# Patient Record
Sex: Male | Born: 1965 | Race: White | Hispanic: No | Marital: Married | Smoking: Never smoker
Health system: Southern US, Community
[De-identification: ages and names within clinical notes are randomized; demographics above are authoritative.]

## PROBLEM LIST (undated history)

## (undated) DIAGNOSIS — K862 Cyst of pancreas: Secondary | ICD-10-CM

## (undated) DIAGNOSIS — F1110 Opioid abuse, uncomplicated: Secondary | ICD-10-CM

## (undated) DIAGNOSIS — K5792 Diverticulitis of intestine, part unspecified, without perforation or abscess without bleeding: Secondary | ICD-10-CM

## (undated) DIAGNOSIS — Z765 Malingerer [conscious simulation]: Secondary | ICD-10-CM

## (undated) DIAGNOSIS — K219 Gastro-esophageal reflux disease without esophagitis: Secondary | ICD-10-CM

## (undated) DIAGNOSIS — I10 Essential (primary) hypertension: Secondary | ICD-10-CM

## (undated) DIAGNOSIS — G43909 Migraine, unspecified, not intractable, without status migrainosus: Secondary | ICD-10-CM

## (undated) DIAGNOSIS — K449 Diaphragmatic hernia without obstruction or gangrene: Secondary | ICD-10-CM

## (undated) DIAGNOSIS — R011 Cardiac murmur, unspecified: Secondary | ICD-10-CM

## (undated) DIAGNOSIS — G8929 Other chronic pain: Secondary | ICD-10-CM

## (undated) HISTORY — DX: Cardiac murmur, unspecified: R01.1

## (undated) HISTORY — DX: Diaphragmatic hernia without obstruction or gangrene: K44.9

## (undated) HISTORY — DX: Migraine, unspecified, not intractable, without status migrainosus: G43.909

## (undated) HISTORY — PX: OTHER SURGICAL HISTORY: SHX169

## (undated) HISTORY — DX: Essential (primary) hypertension: I10

## (undated) HISTORY — DX: Gastro-esophageal reflux disease without esophagitis: K21.9

---

## 2010-07-15 ENCOUNTER — Ambulatory Visit (INDEPENDENT_AMBULATORY_CARE_PROVIDER_SITE_OTHER): Payer: Medicare Other | Admitting: Internal Medicine

## 2010-07-15 DIAGNOSIS — K257 Chronic gastric ulcer without hemorrhage or perforation: Secondary | ICD-10-CM

## 2010-07-29 NOTE — Consult Note (Signed)
NAMEEASTEN, MACEACHERN                 ACCOUNT NO.:  [id number]  MEDICAL RECORD NO.:  [id number]           PATIENT TYPE:  LOCATION:                                 FACILITY:  PHYSICIAN: Dr. Karilyn Cota  DATE OF BIRTH:  December 26, 1965  DATE OF CONSULTATION: DATE OF DISCHARGE:                                CONSULTATION   REASON FOR CONSULTATION:  This is a referral from Dr. Cristy Folks for repeat EGD for gastric ulcer.  HISTORY OF PRESENT ILLNESS:  Shawn Ayala is a 45 year old male referred to our office by Dr. Cristy Folks for followup of his gastric ulcer.  He underwent an EGD on May 29, 2010, for epigastric pain.  He was a patient at [facility].  He underwent an EGD on May 29, 2010.  The EGD revealed a hiatal hernia.  He also had an ulcer in the body of the stomach.  The Z-line was at 30 cm, and he had duodenitis in the bulb of the duodenum.  The biopsy report from the stomach revealed acute gastritis. The findings were consistent with proximity to an ulcer, but did not show the ulcer itself.  He did receive treatment for H. pylori.  He was started on rabeprazole 20 mg p.o. x5 days, amoxicillin 1 g p.o. twice a day x5 days.  Then, he was to start rabeprazole 20 mg x5 days, then clarithromycin 500 mg p.o. b.i.d. x5 days, tinidazole 500 mg p.o. b.i.d. x7 days.  Hemoglobin on May 30, 2010, was 12.5.  Mr. Toral as says he continues to have epigastric pain and it is radiating down into his right abdomen.  He has had symptoms for 3 months.  He also complains of dysphagia for 1 year.  He says foods are lodging in his upper esophagus.  Breads and meets in particular will lodge.  Soft foods such as macaroni and cheese are not lodging.  He has to vomit the bolus back up when this occurs.  He denies any weight loss though his appetite has been decreased.  There has been no constipation or diarrhea.  He usually has two bowel movements a day, brown in color. He denies any melena or rectal bleeding.  He does  complain of epigastric pain.  HOME MEDICATIONS:  At present, he is on omeprazole once a day.  He is allergic to [facility] which causes nausea.  SURGERIES:  He has had a left inguinal hernia repair 2-3 years ago in [place], and he had tubes in his ears as a child.  Previous medical history includes the present gastric ulcer, hiatal hernia, hypertension, and mild clinical retardation.  FAMILY HISTORY:  His mother is deceased from heart failure.  Father is deceased from CVA.  Two sisters in good health.  He is married.  He is disabled.  He does not smoke, drink, or do drugs, and he has one child in good health.  OBJECTIVE:  His weight is 160.8, height 5 feet 3 inches, temperature 97.1, blood pressure 440/86, his pulse is 76.  His sclerae are anicteric.  Conjunctivae is pink.  Teeth, he has multiple missing teeth. His oral mucosa  is moist.  Thyroid is normal.  There is no cervical lymphadenopathy.  His lungs are clear.  Heart is regular rate and rhythm.  I did not hear a murmur.  Abdomen is soft.  Bowel sounds are positive.  He complains of epigastric tenderness on palpation.  There is no edema to his lower extremities.  ASSESSMENT:  Mr. Marple is a 45 year old male with a gastric ulcer.  He is here today for reevaluation.  He is in need of a surveillance EGD to reassess his ulcer to see if it is healing.  He also complains of dysphagia to solids.  RECOMMENDATIONS:  We will schedule an EGD/ED with Dr. Karilyn Cota.  He will continue his omeprazole.  He will take it twice a day instead of once a day.  He is to have his other medications filled by his primary care, Dr. Cristy Folks.  He became very upset when I did not refill his hydrocodone and stormed out of my office.  Thank you for allowing [place] to participate in his care.    ______________________________ Dorene Ar, NP   ______________________________ Dr. Karilyn Cota         TS/MEDQ  D:  07/15/2010  T:  07/16/2010  Job:   [id number]  Electronically Signed by Dorene Ar PA on 07/28/2010 09:07:14 AM Electronically Signed by Lorrin Goodell M.D. on 07/29/2010 10:51:43 AM

## 2010-08-21 ENCOUNTER — Other Ambulatory Visit (INDEPENDENT_AMBULATORY_CARE_PROVIDER_SITE_OTHER): Payer: Self-pay | Admitting: Internal Medicine

## 2010-08-21 ENCOUNTER — Encounter (HOSPITAL_BASED_OUTPATIENT_CLINIC_OR_DEPARTMENT_OTHER): Payer: Medicare Other | Admitting: Internal Medicine

## 2010-08-21 ENCOUNTER — Ambulatory Visit (HOSPITAL_COMMUNITY)
Admission: RE | Admit: 2010-08-21 | Discharge: 2010-08-21 | Disposition: A | Discharge status: Home / Self Care | Payer: Medicare Other | Source: Ambulatory Visit | Attending: Internal Medicine | Admitting: Internal Medicine

## 2010-08-21 DIAGNOSIS — K2960 Other gastritis without bleeding: Secondary | ICD-10-CM | POA: Insufficient documentation

## 2010-08-21 DIAGNOSIS — R1013 Epigastric pain: Secondary | ICD-10-CM | POA: Insufficient documentation

## 2010-08-21 DIAGNOSIS — K222 Esophageal obstruction: Secondary | ICD-10-CM | POA: Insufficient documentation

## 2010-08-21 DIAGNOSIS — K449 Diaphragmatic hernia without obstruction or gangrene: Secondary | ICD-10-CM | POA: Insufficient documentation

## 2010-08-21 DIAGNOSIS — K2270 Barrett's esophagus without dysplasia: Secondary | ICD-10-CM

## 2010-08-21 DIAGNOSIS — R1310 Dysphagia, unspecified: Secondary | ICD-10-CM | POA: Insufficient documentation

## 2010-08-21 DIAGNOSIS — K219 Gastro-esophageal reflux disease without esophagitis: Secondary | ICD-10-CM

## 2010-08-21 DIAGNOSIS — I10 Essential (primary) hypertension: Secondary | ICD-10-CM | POA: Insufficient documentation

## 2010-08-21 DIAGNOSIS — Z79899 Other long term (current) drug therapy: Secondary | ICD-10-CM | POA: Insufficient documentation

## 2010-08-21 DIAGNOSIS — K259 Gastric ulcer, unspecified as acute or chronic, without hemorrhage or perforation: Secondary | ICD-10-CM

## 2010-09-02 ENCOUNTER — Other Ambulatory Visit (INDEPENDENT_AMBULATORY_CARE_PROVIDER_SITE_OTHER): Payer: Self-pay | Admitting: Internal Medicine

## 2010-09-02 DIAGNOSIS — R1013 Epigastric pain: Secondary | ICD-10-CM

## 2010-09-04 ENCOUNTER — Ambulatory Visit (HOSPITAL_COMMUNITY): Payer: Medicare Other

## 2010-09-08 ENCOUNTER — Ambulatory Visit (INDEPENDENT_AMBULATORY_CARE_PROVIDER_SITE_OTHER): Payer: Medicare Other | Admitting: Internal Medicine

## 2010-09-15 ENCOUNTER — Ambulatory Visit (HOSPITAL_COMMUNITY)
Admission: RE | Admit: 2010-09-15 | Discharge: 2010-09-15 | Disposition: A | Discharge status: Home / Self Care | Payer: Medicare Other | Source: Ambulatory Visit | Attending: Internal Medicine | Admitting: Internal Medicine

## 2010-09-15 DIAGNOSIS — R1013 Epigastric pain: Secondary | ICD-10-CM | POA: Insufficient documentation

## 2010-09-15 DIAGNOSIS — Q619 Cystic kidney disease, unspecified: Secondary | ICD-10-CM | POA: Insufficient documentation

## 2010-09-15 NOTE — Op Note (Signed)
Shawn Ayala, Shawn Ayala                 ACCOUNT NO.:  [id number]  MEDICAL RECORD NO.:  [id number]           PATIENT TYPE:  O  LOCATION:  [facility]                          FACILITY:  [facility]  PHYSICIAN:  Lionel December, M.D.    DATE OF BIRTH:  07/25/1965  DATE OF PROCEDURE:  08/21/2010 DATE OF DISCHARGE:                              OPERATIVE REPORT   PROCEDURE:  Esophagogastroduodenoscopy with esophageal dilation.  INDICATION:  Shawn Ayala is a 45 year old Caucasian male who continues to experience sharp epigastric pain.  He also complains of dysphagia to solids.  He was diagnosed with proximal gastric ulcer by Dr. Mikal Plane of [place], [place] on May 29, 2010.  Biopsy was taken from this ulcer margin, was benign and his CLO test was positive and he has had 1 week of therapy with tinidazole, AcipHex, and clarithromycin.  He denies weight loss or melena.  He also complains of pain in his lower abdomen which is felt to be a separate tissue.  Procedure risks were reviewed with the patient.  Informed consent was obtained.  MEDICATIONS FOR CONSCIOUS SEDATION:  Cetacaine spray for pharyngeal topical anesthesia, Demerol 50 mg IV, and Versed 5 mg IV.  FINDINGS:  The procedure performed in endoscopy suite.  The patient's vital signs and O2 saturations were monitored during the procedure and remained stable.  The patient was placed in left lateral recumbent position and Pentax videoscope was passed via oropharynx without any difficulty into esophagus.  Esophagus:  Mucosa of the proximal segment was normal.  Distally, there was extensive scarring with pseudodiverticular formation and noncritical narrowing.  His esophagus was felt to be rather short.  GE junction was estimated to be at 29 cm and he has salmon-colored mucosa above that. Length was estimated to be less than 2 cm, possibly around 15-16 mm.  A large hiatal hernia with dependent segment on the left side with fluid in it.  Hiatus was  wide open.  There were multiple linear erosions at the level of hiatus.  There was a scar on the right side and there were 2 small ulcers, 3-4 mm, on the left side surrounded by edematous and erythematous mucosa.  Stomach:  Part of the stomach that was below the diaphragm reveals no abnormality except prepyloric erythema and a scar.  Pyloric channel was patent.  Hernia was easily seen on retroflex view.  Duodenum:  Bulbar mucosa was normal.  Scope was passed to second part of duodenum where mucosa and folds were normal.  Esophageal stricture was dilated with a balloon initially to 15 mm and carried to 16.5 and 18 mm, but no mucosal disruption induced.  As the balloon was deflated and withdrawn, antral biopsy was taken for CLO test, followed by biopsy from the salmon-colored mucosa to confirm that he has Barrett esophagus.  Endoscope was withdrawn.  The patient tolerated the procedure well.  FINAL DIAGNOSES: 1. 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 2. 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 3. Large sliding hiatal hernia with dependent segment on the left side     with 2 small ulcers.  A third ulcer has healed.  Multiple erosions     also noted at this level. 4. Prepyloric gastritis. 5. Biopsy taken for CLO test.  RECOMMENDATIONS:  Antireflux measures reinforced.  We will increase his omeprazole to 20 mg before breakfast and evening meal.  Prescription given for 60 with 5 refills.  I will be contacting the patient with results of biopsy and further recommendations.          ______________________________ Lionel December, M.D.     NR/MEDQ  D:  08/21/2010  T:  08/22/2010  Job:  [id number]  cc:   Mikal Plane, MD  Electronically Signed by Lionel December M.D. on 09/15/2010 10:02:58 AM

## 2010-09-23 ENCOUNTER — Ambulatory Visit (INDEPENDENT_AMBULATORY_CARE_PROVIDER_SITE_OTHER): Payer: Medicare Other | Admitting: Internal Medicine

## 2010-09-23 DIAGNOSIS — R109 Unspecified abdominal pain: Secondary | ICD-10-CM

## 2010-09-23 DIAGNOSIS — K219 Gastro-esophageal reflux disease without esophagitis: Secondary | ICD-10-CM

## 2011-03-04 ENCOUNTER — Encounter (INDEPENDENT_AMBULATORY_CARE_PROVIDER_SITE_OTHER): Payer: Self-pay | Admitting: *Deleted

## 2011-03-12 ENCOUNTER — Ambulatory Visit (INDEPENDENT_AMBULATORY_CARE_PROVIDER_SITE_OTHER): Payer: Medicare Other | Admitting: Internal Medicine

## 2011-03-25 ENCOUNTER — Ambulatory Visit (INDEPENDENT_AMBULATORY_CARE_PROVIDER_SITE_OTHER): Payer: Medicare Other | Admitting: Internal Medicine

## 2011-03-25 ENCOUNTER — Encounter (INDEPENDENT_AMBULATORY_CARE_PROVIDER_SITE_OTHER): Payer: Self-pay | Admitting: Internal Medicine

## 2011-03-25 VITALS — BP 132/80 | HR 80 | Temp 97.3°F | Ht 63.0 in | Wt 157.5 lb

## 2011-03-25 DIAGNOSIS — K219 Gastro-esophageal reflux disease without esophagitis: Secondary | ICD-10-CM | POA: Insufficient documentation

## 2011-03-25 DIAGNOSIS — I10 Essential (primary) hypertension: Secondary | ICD-10-CM | POA: Insufficient documentation

## 2011-03-25 NOTE — Progress Notes (Signed)
Subjective:     Patient ID: Shawn Ayala, male   DOB: [birth year], 46 y.o.   MRN: [id number]  HPI  Shawn Ayala is a 46 yr old male here today for follow up. He was last seen in our office for GERD. He underwent underwent and EGD in May of this year.  He had a short esophagus with extensive scarring and pseudo diverticular formation and a soft stricture at the GE junction which was dilated.  Biopsy revealed no evidence of intestinal metaplasia, dysplasia or malignancy. H. Pylori was negative. He tells me his heart burn is okay as long as he takes the omeprazole.  He does not have nausea or vomiting.  He   has dysphagia a couple of times a week.  He says he has an appt in [place] with a specialist concerning his hiatal hernia on the 8th of t his month. He does not want to be scheduled for a Barium Swallow test at this time.  He cannot eat roast or chicken or anything stringy.  He has changed his eating habits.  He has lost 5 pounds since his last visit.  Appetite is not good. He says he afraid to eat because he is afraid foods will lodge.  He has a BM about 1-2 times a day. No rectal bleeding or melena.   He also c/o frequent migraine headaches and is taking Fioricet which he says is not helping. 09/15/2010 US abdomen for epigastric pain: Normal sonographic appearance of the gallbladder and normal caliber CBD. 2 cm mid pole right renal cyst. Review of Systems see hpi Current Outpatient Prescriptions  Medication Sig Dispense Refill  . cloNIDine (CATAPRES) 0.1 MG tablet Take 0.1 mg by mouth 2 (two) times daily.        Marland Kitchen omeprazole (PRILOSEC) 20 MG capsule Take 20 mg by mouth daily.         Past Medical History  Diagnosis Date  . GERD (gastroesophageal reflux disease)   . Hypertension   . Heart murmur   . Hiatal hernia   . Migraines    Past Surgical History  Procedure Date  . Left inguinal hernia repair    History   Social History  . Marital Status: Married    Spouse Name: N/A    Number of Children:  N/A  . Years of Education: N/A   Occupational History  . Not on file.   Social History Main Topics  . Smoking status: Never Smoker   . Smokeless tobacco: Not on file   Comment: Uses snuff since age 77yr  . Alcohol Use: No  . Drug Use: No  . Sexually Active: Not on file   Other Topics Concern  . Not on file   Social History Narrative  . No narrative on file   Family Status  Relation Status Death Age  . Mother Deceased     CHF  . Father Deceased     CVA  . Sister Alive     fair health   Allergies  Allergen Reactions  . Darvocet (Propoxyphene N-Acetaminophen)   . Tramadol        Objective:   Physical Exam Filed Vitals:   03/25/11 1502  Height: 5\' 3"  (1.6 m)  Weight: 157 lb 8 oz (71.442 kg)    Alert and oriented. Skin warm and dry. Oral mucosa is moist. Natural teeth in good condition. Sclera anicteric, conjunctivae is pink. Thyroid not enlarged. No cervical lymphadenopathy. Lungs clear. Heart regular rate and rhythm.  Abdomen is  soft. Bowel sounds are positive. No hepatomegaly. No abdominal masses felt. Epigastric tenderness.  No edema to lower extremities. Patient is alert and oriented.     Assessment:    GERD for the most part controlled at this time. With the Omeprazole. Dysphagia.  He would like to put the Barium Swallow on hold till he sees specialist for his hiatal hernia.    Plan:    Continue the Omeprazole.  OV in 6 months.  Avoid foods that are known to lodge in your esophagus.  Chew food well.

## 2011-03-25 NOTE — Patient Instructions (Addendum)
Continue present medications. Keep appt with specialist this week for your hiatal hernia. OV in 6 months. Find a PCP to manage your headaches for pain management

## 2011-09-01 ENCOUNTER — Other Ambulatory Visit (INDEPENDENT_AMBULATORY_CARE_PROVIDER_SITE_OTHER): Payer: Self-pay | Admitting: Internal Medicine

## 2011-09-22 ENCOUNTER — Ambulatory Visit (INDEPENDENT_AMBULATORY_CARE_PROVIDER_SITE_OTHER): Payer: Medicare Other | Admitting: Internal Medicine

## 2013-06-11 ENCOUNTER — Encounter (HOSPITAL_COMMUNITY): Payer: Self-pay | Admitting: *Deleted

## 2013-06-11 ENCOUNTER — Encounter (HOSPITAL_COMMUNITY)
Admission: EM | Disposition: A | Discharge status: Home / Self Care | Payer: Self-pay | Source: Other Acute Inpatient Hospital | Attending: Internal Medicine

## 2013-06-11 ENCOUNTER — Inpatient Hospital Stay (HOSPITAL_COMMUNITY)
Admission: EM | Admit: 2013-06-11 | Discharge: 2013-06-16 | DRG: 392 | Disposition: A | Discharge status: Home / Self Care | Payer: Medicare Other | Source: Other Acute Inpatient Hospital | Attending: Internal Medicine | Admitting: Internal Medicine

## 2013-06-11 DIAGNOSIS — D509 Iron deficiency anemia, unspecified: Secondary | ICD-10-CM

## 2013-06-11 DIAGNOSIS — R112 Nausea with vomiting, unspecified: Secondary | ICD-10-CM

## 2013-06-11 DIAGNOSIS — K222 Esophageal obstruction: Secondary | ICD-10-CM | POA: Diagnosis present

## 2013-06-11 DIAGNOSIS — K862 Cyst of pancreas: Secondary | ICD-10-CM

## 2013-06-11 DIAGNOSIS — K311 Adult hypertrophic pyloric stenosis: Secondary | ICD-10-CM

## 2013-06-11 DIAGNOSIS — R1013 Epigastric pain: Secondary | ICD-10-CM

## 2013-06-11 DIAGNOSIS — Y69 Unspecified misadventure during surgical and medical care: Secondary | ICD-10-CM | POA: Diagnosis present

## 2013-06-11 DIAGNOSIS — F329 Major depressive disorder, single episode, unspecified: Secondary | ICD-10-CM | POA: Diagnosis present

## 2013-06-11 DIAGNOSIS — T148XXA Other injury of unspecified body region, initial encounter: Secondary | ICD-10-CM | POA: Diagnosis present

## 2013-06-11 DIAGNOSIS — F1910 Other psychoactive substance abuse, uncomplicated: Secondary | ICD-10-CM | POA: Diagnosis present

## 2013-06-11 DIAGNOSIS — K3184 Gastroparesis: Principal | ICD-10-CM

## 2013-06-11 DIAGNOSIS — D500 Iron deficiency anemia secondary to blood loss (chronic): Secondary | ICD-10-CM

## 2013-06-11 DIAGNOSIS — G988 Other disorders of nervous system: Secondary | ICD-10-CM | POA: Diagnosis present

## 2013-06-11 DIAGNOSIS — K869 Disease of pancreas, unspecified: Secondary | ICD-10-CM

## 2013-06-11 DIAGNOSIS — R634 Abnormal weight loss: Secondary | ICD-10-CM

## 2013-06-11 DIAGNOSIS — D126 Benign neoplasm of colon, unspecified: Secondary | ICD-10-CM

## 2013-06-11 DIAGNOSIS — K219 Gastro-esophageal reflux disease without esophagitis: Secondary | ICD-10-CM

## 2013-06-11 DIAGNOSIS — T4275XA Adverse effect of unspecified antiepileptic and sedative-hypnotic drugs, initial encounter: Secondary | ICD-10-CM | POA: Diagnosis present

## 2013-06-11 DIAGNOSIS — Z79899 Other long term (current) drug therapy: Secondary | ICD-10-CM

## 2013-06-11 DIAGNOSIS — F1210 Cannabis abuse, uncomplicated: Secondary | ICD-10-CM | POA: Diagnosis present

## 2013-06-11 DIAGNOSIS — Z8711 Personal history of peptic ulcer disease: Secondary | ICD-10-CM

## 2013-06-11 DIAGNOSIS — G8929 Other chronic pain: Secondary | ICD-10-CM

## 2013-06-11 DIAGNOSIS — E46 Unspecified protein-calorie malnutrition: Secondary | ICD-10-CM | POA: Diagnosis present

## 2013-06-11 DIAGNOSIS — F3289 Other specified depressive episodes: Secondary | ICD-10-CM | POA: Diagnosis present

## 2013-06-11 DIAGNOSIS — K209 Esophagitis, unspecified without bleeding: Secondary | ICD-10-CM

## 2013-06-11 DIAGNOSIS — K5730 Diverticulosis of large intestine without perforation or abscess without bleeding: Secondary | ICD-10-CM

## 2013-06-11 DIAGNOSIS — K863 Pseudocyst of pancreas: Secondary | ICD-10-CM

## 2013-06-11 DIAGNOSIS — I10 Essential (primary) hypertension: Secondary | ICD-10-CM

## 2013-06-11 DIAGNOSIS — K449 Diaphragmatic hernia without obstruction or gangrene: Secondary | ICD-10-CM | POA: Diagnosis present

## 2013-06-11 DIAGNOSIS — D490 Neoplasm of unspecified behavior of digestive system: Secondary | ICD-10-CM

## 2013-06-11 DIAGNOSIS — K8689 Other specified diseases of pancreas: Secondary | ICD-10-CM

## 2013-06-11 DIAGNOSIS — E876 Hypokalemia: Secondary | ICD-10-CM

## 2013-06-11 HISTORY — DX: Cyst of pancreas: K86.2

## 2013-06-11 HISTORY — PX: ESOPHAGOGASTRODUODENOSCOPY: SHX5428

## 2013-06-11 LAB — COMPREHENSIVE METABOLIC PANEL
ALBUMIN: 3.1 g/dL — AB (ref 3.5–5.2)
ALT: 7 U/L (ref 0–53)
AST: 14 U/L (ref 0–37)
Alkaline Phosphatase: 61 U/L (ref 39–117)
BUN: 9 mg/dL (ref 6–23)
CHLORIDE: 102 meq/L (ref 96–112)
CO2: 28 mEq/L (ref 19–32)
CREATININE: 0.81 mg/dL (ref 0.50–1.35)
Calcium: 8.6 mg/dL (ref 8.4–10.5)
GFR calc Af Amer: >90 mL/min (ref >90)
GFR calc non Af Amer: >90 mL/min (ref >90)
Glucose, Bld: 91 mg/dL (ref 70–99)
POTASSIUM: 3.3 meq/L — AB (ref 3.7–5.3)
Sodium: 143 mEq/L (ref 137–147)
TOTAL PROTEIN: 6.2 g/dL (ref 6.0–8.3)
Total Bilirubin: 0.3 mg/dL (ref 0.3–1.2)

## 2013-06-11 LAB — ABO/RH: ABO/RH(D): O POS

## 2013-06-11 LAB — RAPID URINE DRUG SCREEN, HOSP PERFORMED
Amphetamines: NOT DETECTED
BARBITURATES: NOT DETECTED
BENZODIAZEPINES: NOT DETECTED
Cocaine: POSITIVE — AB
Opiates: POSITIVE — AB
Tetrahydrocannabinol: POSITIVE — AB

## 2013-06-11 LAB — TSH: TSH: 1.189 u[IU]/mL (ref 0.350–4.500)

## 2013-06-11 LAB — CBC
HEMATOCRIT: 24.9 % — AB (ref 39.0–52.0)
Hemoglobin: 6.8 g/dL — CL (ref 13.0–17.0)
MCH: 17 pg — ABNORMAL LOW (ref 26.0–34.0)
MCHC: 27.3 g/dL — ABNORMAL LOW (ref 30.0–36.0)
MCV: 62.4 fL — ABNORMAL LOW (ref 78.0–100.0)
PLATELETS: 577 10*3/uL — AB (ref 150–400)
RBC: 3.99 MIL/uL — AB (ref 4.22–5.81)
RDW: 19.5 % — ABNORMAL HIGH (ref 11.5–15.5)
WBC: 6.9 10*3/uL (ref 4.0–10.5)

## 2013-06-11 LAB — FERRITIN: Ferritin: 1 ng/mL — ABNORMAL LOW (ref 22–322)

## 2013-06-11 LAB — FOLATE

## 2013-06-11 LAB — IRON AND TIBC
IRON: 21 ug/dL — AB (ref 42–135)
Saturation Ratios: 5 % — ABNORMAL LOW (ref 20–55)
TIBC: 409 ug/dL (ref 215–435)
UIBC: 388 ug/dL (ref 125–400)

## 2013-06-11 LAB — PREPARE RBC (CROSSMATCH)

## 2013-06-11 LAB — RETICULOCYTES
RBC.: 3.99 MIL/uL — ABNORMAL LOW (ref 4.22–5.81)
RETIC COUNT ABSOLUTE: 55.9 10*3/uL (ref 19.0–186.0)
Retic Ct Pct: 1.4 % (ref 0.4–3.1)

## 2013-06-11 LAB — VITAMIN B12: Vitamin B-12: 302 pg/mL (ref 211–911)

## 2013-06-11 LAB — MAGNESIUM: Magnesium: 2.1 mg/dL (ref 1.5–2.5)

## 2013-06-11 SURGERY — EGD (ESOPHAGOGASTRODUODENOSCOPY)
Anesthesia: Moderate Sedation

## 2013-06-11 MED ORDER — METOCLOPRAMIDE HCL 5 MG/ML IJ SOLN
10.0000 mg | Freq: Four times a day (QID) | INTRAMUSCULAR | Status: DC
  Administered 2013-06-11 – 2013-06-12 (×5): 10 mg via INTRAVENOUS
  Filled 2013-06-11 (×8): qty 2

## 2013-06-11 MED ORDER — SODIUM CHLORIDE 0.9 % IV SOLN
INTRAVENOUS | Status: DC
  Administered 2013-06-11 – 2013-06-15 (×5): via INTRAVENOUS

## 2013-06-11 MED ORDER — ACETAMINOPHEN 650 MG RE SUPP: 650.0000 mg | Freq: Four times a day (QID) | RECTAL | Status: DC | PRN

## 2013-06-11 MED ORDER — HYDROMORPHONE HCL PF 1 MG/ML IJ SOLN
INTRAMUSCULAR | Status: AC
  Filled 2013-06-11: qty 1

## 2013-06-11 MED ORDER — MIDAZOLAM HCL 10 MG/2ML IJ SOLN
INTRAMUSCULAR | Status: DC | PRN
  Administered 2013-06-11 (×2): 2 mg via INTRAVENOUS

## 2013-06-11 MED ORDER — FENTANYL CITRATE 0.05 MG/ML IJ SOLN
INTRAMUSCULAR | Status: AC
  Filled 2013-06-11: qty 2

## 2013-06-11 MED ORDER — HYDROMORPHONE HCL PF 1 MG/ML IJ SOLN
1.0000 mg | INTRAMUSCULAR | Status: DC | PRN
  Administered 2013-06-11 (×2): 2 mg via INTRAVENOUS
  Administered 2013-06-11 – 2013-06-12 (×4): 1 mg via INTRAVENOUS
  Filled 2013-06-11: qty 2
  Filled 2013-06-11 (×4): qty 1

## 2013-06-11 MED ORDER — FENTANYL CITRATE 0.05 MG/ML IJ SOLN
INTRAMUSCULAR | Status: DC | PRN
  Administered 2013-06-11 (×2): 25 ug via INTRAVENOUS

## 2013-06-11 MED ORDER — CLONIDINE HCL 0.1 MG PO TABS
0.1000 mg | ORAL_TABLET | Freq: Two times a day (BID) | ORAL | Status: DC
  Administered 2013-06-11 – 2013-06-16 (×10): 0.1 mg via ORAL
  Filled 2013-06-11 (×14): qty 1

## 2013-06-11 MED ORDER — ACETAMINOPHEN 325 MG PO TABS
650.0000 mg | ORAL_TABLET | Freq: Four times a day (QID) | ORAL | Status: DC | PRN
  Administered 2013-06-12: 650 mg via ORAL
  Filled 2013-06-11 (×2): qty 2

## 2013-06-11 MED ORDER — ONDANSETRON HCL 4 MG PO TABS: 4.0000 mg | ORAL_TABLET | Freq: Four times a day (QID) | ORAL | Status: DC | PRN

## 2013-06-11 MED ORDER — ONDANSETRON HCL 4 MG/2ML IJ SOLN
4.0000 mg | Freq: Four times a day (QID) | INTRAMUSCULAR | Status: DC | PRN
  Administered 2013-06-11 – 2013-06-14 (×3): 4 mg via INTRAVENOUS
  Filled 2013-06-11 (×3): qty 2

## 2013-06-11 MED ORDER — BUTAMBEN-TETRACAINE-BENZOCAINE 2-2-14 % EX AERO
INHALATION_SPRAY | CUTANEOUS | Status: DC | PRN
  Administered 2013-06-11: 2 via TOPICAL

## 2013-06-11 MED ORDER — HYDROMORPHONE HCL PF 1 MG/ML IJ SOLN
1.0000 mg | INTRAMUSCULAR | Status: DC | PRN
  Administered 2013-06-11: 1 mg via INTRAVENOUS
  Filled 2013-06-11: qty 1

## 2013-06-11 MED ORDER — MIDAZOLAM HCL 5 MG/ML IJ SOLN
INTRAMUSCULAR | Status: AC
  Filled 2013-06-11: qty 2

## 2013-06-11 MED ORDER — POTASSIUM CHLORIDE 10 MEQ/100ML IV SOLN
10.0000 meq | INTRAVENOUS | Status: AC
  Administered 2013-06-11 – 2013-06-12 (×3): 10 meq via INTRAVENOUS
  Filled 2013-06-11 (×2): qty 100

## 2013-06-11 MED ORDER — POTASSIUM CHLORIDE 10 MEQ/100ML IV SOLN
10.0000 meq | INTRAVENOUS | Status: AC
  Administered 2013-06-11: 10 meq via INTRAVENOUS
  Filled 2013-06-11: qty 100

## 2013-06-11 MED ORDER — PANTOPRAZOLE SODIUM 40 MG IV SOLR
40.0000 mg | Freq: Two times a day (BID) | INTRAVENOUS | Status: DC
  Administered 2013-06-11 – 2013-06-12 (×3): 40 mg via INTRAVENOUS
  Filled 2013-06-11 (×4): qty 40

## 2013-06-11 NOTE — H&P (View-Only) (Signed)
Referring Provider: No ref. provider found Primary Care Physician:  No primary provider on file. Primary Gastroenterologist:  Dr. Laural Golden in [place]  Reason for Consultation:  GOO and pancreatic lesion  HPI: Shawn Ayala is a 48 y.o. 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  He has a history of peptic ulcer disease documented by EGD in 08/2010 by Dr. Laural Golden with EGJ stricture, large HH and ulcers at [facility] and underwent balloon esophageal dilatation. He had a laparoscopic reduction and repair of his HH in [place] 3 years ago.  This recurred 3 months later and another surgeon performed an open hiatal hernia repair. Since that second operation he has had a 70 pound weight loss, vomits food and drink. Occasionally sees some green emesis but mostly nonbilious. Over the past 2 years, despite the weight loss and vomiting, he has not had another upper endoscopy or colonoscopy. He has apparently never had a colonoscopy. He has been found to be anemic with Hgb 6.8 grams and is currently being transfuse with his first unit of PRBC's.  MCV is very low and iron studies, Vitamin B12 levels and folate are pending.   His old CT scans from 3 years ago did show a large type III hiatal hernia but there was no pancreatic mass according to review between the medicine team and radiology.  Current CT scan shows significant distention of the stomach all the way down to the duodenal bulb. No contrast passes the duodenal bulb. There is no mass or adenopathy there and the cause of the obstruction is not clear. On the anterior surface of the pancreas there is a 2 cm hypodense lesion. This is very smooth. Does not look invasive. This is very anterior and does not appear to be the cause of the obstruction.   Patient states that since the surgery he has not been able to eat or drink much at all, but it has become  progressively worse over the past 6-8 months to the point where even liquids come back up.  Says that sometimes they come up within 15 minutes, but other times it is an hour.  Also has foul smell belching.  Has pain constantly.  Is upset and frustrated.  Says that the surgeon who performed the last surgery in [place] only saw him one time post-op and has refused to see him since then.  Patient says that this has taken a huge toll on his life to the point where is wife wants to leave him.   Past Medical History  Diagnosis Date  . GERD (gastroesophageal reflux disease)   . Hypertension   . Heart murmur   . Hiatal hernia   . Migraines     Past Surgical History  Procedure Laterality Date  . Left inguinal hernia repair      Prior to Admission medications   Medication Sig Start Date End Date Taking? Authorizing Provider  acetaminophen (TYLENOL) 325 MG tablet Take 650 mg by mouth every 6 (six) hours as needed for mild pain, fever or headache.   Yes Historical Provider, MD  cloNIDine (CATAPRES) 0.1 MG tablet Take 0.1 mg by mouth 2 (two) times daily.     Yes Historical Provider, MD  esomeprazole (NEXIUM) 40 MG capsule Take 40 mg by mouth 2 (two) times daily before a meal.   Yes Historical Provider, MD    Current Facility-Administered Medications  Medication Dose Route Frequency  Provider Last Rate Last Dose  . 0.9 %  sodium chloride infusion   Intravenous Continuous Kathie Dike, MD 75 mL/hr at 06/11/13 0429    . acetaminophen (TYLENOL) tablet 650 mg  650 mg Oral Q6H PRN Kathie Dike, MD       Or  . acetaminophen (TYLENOL) suppository 650 mg  650 mg Rectal Q6H PRN Kathie Dike, MD      . cloNIDine (CATAPRES) tablet 0.1 mg  0.1 mg Oral BID Kathie Dike, MD      . HYDROmorphone (DILAUDID) injection 1-2 mg  1-2 mg Intravenous Q4H PRN Delfina Redwood, MD   2 mg at 06/11/13 0900  . ondansetron (ZOFRAN) tablet 4 mg  4 mg Oral Q6H PRN Kathie Dike, MD       Or  . ondansetron  (ZOFRAN) injection 4 mg  4 mg Intravenous Q6H PRN Kathie Dike, MD   4 mg at 06/11/13 0801  . pantoprazole (PROTONIX) injection 40 mg  40 mg Intravenous Q12H Kathie Dike, MD      . potassium chloride 10 mEq in 100 mL IVPB  10 mEq Intravenous Q1 Hr x 4 Delfina Redwood, MD        Allergies as of 06/11/2013 - Review Complete 06/11/2013  Allergen Reaction Noted  . Darvocet [propoxyphene n-acetaminophen]  03/25/2011  . Nsaids  06/11/2013  . Tramadol Nausea And Vomiting 03/25/2011    History reviewed. No pertinent family history.  History   Social History  . Marital Status: Married    Spouse Name: N/A    Number of Children: N/A  . Years of Education: N/A   Occupational History  . Not on file.   Social History Main Topics  . Smoking status: Never Smoker   . Smokeless tobacco: Not on file     Comment: Uses snuff since age 71yr  . Alcohol Use: No  . Drug Use: No  . Sexual Activity: Not on file   Other Topics Concern  . Not on file   Social History Narrative  . No narrative on file    Review of Systems: Ten point ROS is O/W negative except as mentioned in HPI.  Physical Exam: Vital signs in last 24 hours: Temp:  [97.9 F (36.6 C)-98.4 F (36.9 C)] 98.2 F (36.8 C) (03/22 0945) Pulse Rate:  [67-76] 70 (03/22 0945) Resp:  [16-18] 18 (03/22 0945) BP: (127-142)/(79-96) 127/88 mmHg (03/22 0945) SpO2:  [95 %-100 %] 95 % (03/22 0945) Weight:  [112 lb (50.803 kg)] 112 lb (50.803 kg) (03/22 0549) Last BM Date: 06/10/13 General:  Alert, thin, pleasant and cooperative in NAD Head:  Normocephalic and atraumatic. Eyes:  Sclera clear, no icterus.  Conjunctiva pink. Ears:  Normal auditory acuity. Mouth:  No deformity or lesions.   Lungs:  Clear throughout to auscultation.  No wheezes, crackles, or rhonchi.  Heart:  Regular rate and rhythm; no murmurs, clicks, rubs, or gallops. Abdomen:  Soft, non-distended.  BS present.  TTP in epigastrium without R/R/G.   Rectal:   Deferred  Msk:  Symmetrical without gross deformities. Pulses:  Normal pulses noted. Extremities:  Without clubbing or edema. Neurologic:  Alert and  oriented x4;  grossly normal neurologically. Skin:  Intact without significant lesions or rashes. Psych:  Alert and cooperative. Normal mood and affect.  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  No mass is apparent on CT scan.  ? chronic PUD vs stricture.  ? Damage to vagus nerve during surgery. -2 cm pancreatic mass:  Does not appear to be the cause of obstruction. -Malnutrition with 70 pound weight loss in 2 years -Microcytic anemia:  Likely due to nutritional deficiencies and no absorption through the small bowel.   PLAN: -Continue IV PPI BID. -Surgery is following as well and trying to obtain previous operative reports from [place], [place]. -EGD today, per Dr. Fuller Plan. -Will likely need EUS at some point as well for tissue diagnosis of pancreatic mass. -Follow-up CA19-9.  ZEHR, JESSICA D.  06/11/2013, 10:03 AM  Pager number [phone]      Attending physician's note   I have taken a history, reviewed the chart and examined the patient. I agree with the Advanced Practitioner's note, impression and recommendations. Gastric distention suggesting GOO at level of duodenal bulb by CT, possible gastroparesis, 2 cm pancreatic lesion, weight loss, malnutrition, microcytic anemia. R/O duodenal ulcer, duodenal stricture, vagus nerve injury with gastroparesis or other obstructing  process. EGD today and IV PPI bid for now. Further evaluation of the pancreatic lesion with MRCP and/or EUS when current problems improved. Likely will need colonoscopy at some point to further evaluate anemia and weight loss. Surgery following.  Pricilla Riffle. Fuller Plan, MD [facility]

## 2013-06-11 NOTE — Progress Notes (Signed)
Called by RN with hgb 6.8. Discussed with Dr. Roderic Palau. Transfuse 1 unit PRBC. Full note to follow.  Doree Barthel, M.D.

## 2013-06-11 NOTE — Consult Note (Signed)
Referring Provider: No ref. provider found Primary Care Physician:  No primary provider on file. Primary Gastroenterologist:  Dr. Laural Golden in [place]  Reason for Consultation:  GOO and pancreatic lesion  HPI: Shawn Ayala is a 48 y.o. 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  He has a history of peptic ulcer disease documented by EGD in 08/2010 by Dr. Laural Golden with EGJ stricture, large HH and ulcers at [facility] and underwent balloon esophageal dilatation. He had a laparoscopic reduction and repair of his HH in [place] 3 years ago.  This recurred 3 months later and another surgeon performed an open hiatal hernia repair. Since that second operation he has had a 70 pound weight loss, vomits food and drink. Occasionally sees some green emesis but mostly nonbilious. Over the past 2 years, despite the weight loss and vomiting, he has not had another upper endoscopy or colonoscopy. He has apparently never had a colonoscopy. He has been found to be anemic with Hgb 6.8 grams and is currently being transfuse with his first unit of PRBC's.  MCV is very low and iron studies, Vitamin B12 levels and folate are pending.   His old CT scans from 3 years ago did show a large type III hiatal hernia but there was no pancreatic mass according to review between the medicine team and radiology.  Current CT scan shows significant distention of the stomach all the way down to the duodenal bulb. No contrast passes the duodenal bulb. There is no mass or adenopathy there and the cause of the obstruction is not clear. On the anterior surface of the pancreas there is a 2 cm hypodense lesion. This is very smooth. Does not look invasive. This is very anterior and does not appear to be the cause of the obstruction.   Patient states that since the surgery he has not been able to eat or drink much at all, but it has become  progressively worse over the past 6-8 months to the point where even liquids come back up.  Says that sometimes they come up within 15 minutes, but other times it is an hour.  Also has foul smell belching.  Has pain constantly.  Is upset and frustrated.  Says that the surgeon who performed the last surgery in [place] only saw him one time post-op and has refused to see him since then.  Patient says that this has taken a huge toll on his life to the point where is wife wants to leave him.   Past Medical History  Diagnosis Date  . GERD (gastroesophageal reflux disease)   . Hypertension   . Heart murmur   . Hiatal hernia   . Migraines     Past Surgical History  Procedure Laterality Date  . Left inguinal hernia repair      Prior to Admission medications   Medication Sig Start Date End Date Taking? Authorizing Provider  acetaminophen (TYLENOL) 325 MG tablet Take 650 mg by mouth every 6 (six) hours as needed for mild pain, fever or headache.   Yes Historical Provider, MD  cloNIDine (CATAPRES) 0.1 MG tablet Take 0.1 mg by mouth 2 (two) times daily.     Yes Historical Provider, MD  esomeprazole (NEXIUM) 40 MG capsule Take 40 mg by mouth 2 (two) times daily before a meal.   Yes Historical Provider, MD    Current Facility-Administered Medications  Medication Dose Route Frequency  Provider Last Rate Last Dose  . 0.9 %  sodium chloride infusion   Intravenous Continuous Jehanzeb Memon, MD 75 mL/hr at 06/11/13 0429    . acetaminophen (TYLENOL) tablet 650 mg  650 mg Oral Q6H PRN Jehanzeb Memon, MD       Or  . acetaminophen (TYLENOL) suppository 650 mg  650 mg Rectal Q6H PRN Jehanzeb Memon, MD      . cloNIDine (CATAPRES) tablet 0.1 mg  0.1 mg Oral BID Jehanzeb Memon, MD      . HYDROmorphone (DILAUDID) injection 1-2 mg  1-2 mg Intravenous Q4H PRN Corinna L Sullivan, MD   2 mg at 06/11/13 0900  . ondansetron (ZOFRAN) tablet 4 mg  4 mg Oral Q6H PRN Jehanzeb Memon, MD       Or  . ondansetron  (ZOFRAN) injection 4 mg  4 mg Intravenous Q6H PRN Jehanzeb Memon, MD   4 mg at 06/11/13 0801  . pantoprazole (PROTONIX) injection 40 mg  40 mg Intravenous Q12H Jehanzeb Memon, MD      . potassium chloride 10 mEq in 100 mL IVPB  10 mEq Intravenous Q1 Hr x 4 Corinna L Sullivan, MD        Allergies as of 06/11/2013 - Review Complete 06/11/2013  Allergen Reaction Noted  . Darvocet [propoxyphene n-acetaminophen]  03/25/2011  . Nsaids  06/11/2013  . Tramadol Nausea And Vomiting 03/25/2011    History reviewed. No pertinent family history.  History   Social History  . Marital Status: Married    Spouse Name: N/A    Number of Children: N/A  . Years of Education: N/A   Occupational History  . Not on file.   Social History Main Topics  . Smoking status: Never Smoker   . Smokeless tobacco: Not on file     Comment: Uses snuff since age 7yr  . Alcohol Use: No  . Drug Use: No  . Sexual Activity: Not on file   Other Topics Concern  . Not on file   Social History Narrative  . No narrative on file    Review of Systems: Ten point ROS is O/W negative except as mentioned in HPI.  Physical Exam: Vital signs in last 24 hours: Temp:  [97.9 F (36.6 C)-98.4 F (36.9 C)] 98.2 F (36.8 C) (03/22 0945) Pulse Rate:  [67-76] 70 (03/22 0945) Resp:  [16-18] 18 (03/22 0945) BP: (127-142)/(79-96) 127/88 mmHg (03/22 0945) SpO2:  [95 %-100 %] 95 % (03/22 0945) Weight:  [112 lb (50.803 kg)] 112 lb (50.803 kg) (03/22 0549) Last BM Date: 06/10/13 General:  Alert, thin, pleasant and cooperative in NAD Head:  Normocephalic and atraumatic. Eyes:  Sclera clear, no icterus.  Conjunctiva pink. Ears:  Normal auditory acuity. Mouth:  No deformity or lesions.   Lungs:  Clear throughout to auscultation.  No wheezes, crackles, or rhonchi.  Heart:  Regular rate and rhythm; no murmurs, clicks, rubs, or gallops. Abdomen:  Soft, non-distended.  BS present.  TTP in epigastrium without R/R/G.   Rectal:   Deferred  Msk:  Symmetrical without gross deformities. Pulses:  Normal pulses noted. Extremities:  Without clubbing or edema. Neurologic:  Alert and  oriented x4;  grossly normal neurologically. Skin:  Intact without significant lesions or rashes. Psych:  Alert and cooperative. Normal mood and affect.  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  No mass is apparent on CT scan.  ? chronic PUD vs stricture.  ? Damage to vagus nerve during surgery. -2 cm pancreatic mass:  Does not appear to be the cause of obstruction. -Malnutrition with 70 pound weight loss in 2 years -Microcytic anemia:  Likely due to nutritional deficiencies and no absorption through the small bowel.   PLAN: -Continue IV PPI BID. -Surgery is following as well and trying to obtain previous operative reports from [place]. -EGD today, per Dr. Stark. -Will likely need EUS at some point as well for tissue diagnosis of pancreatic mass. -Follow-up CA19-9.  ZEHR, JESSICA D.  06/11/2013, 10:03 AM  Pager number [phone]      Attending physician's note   I have taken a history, reviewed the chart and examined the patient. I agree with the Advanced Practitioner's note, impression and recommendations. Gastric distention suggesting GOO at level of duodenal bulb by CT, possible gastroparesis, 2 cm pancreatic lesion, weight loss, malnutrition, microcytic anemia. R/O duodenal ulcer, duodenal stricture, vagus nerve injury with gastroparesis or other obstructing  process. EGD today and IV PPI bid for now. Further evaluation of the pancreatic lesion with MRCP and/or EUS when current problems improved. Likely will need colonoscopy at some point to further evaluate anemia and weight loss. Surgery following.  Malcolm T. Stark, MD FACG 

## 2013-06-11 NOTE — Interval H&P Note (Signed)
History and Physical Interval Note:  06/11/2013 1:01 PM  Shawn Ayala  has presented today for surgery, with the diagnosis of GOO  The various methods of treatment have been discussed with the patient and family. After consideration of risks, benefits and other options for treatment, the patient has consented to  Procedure(s): ESOPHAGOGASTRODUODENOSCOPY (EGD) (N/A) as a surgical intervention .  The patient's history has been reviewed, patient examined, no change in status, stable for surgery.  I have reviewed the patient's chart and labs.  Questions were answered to the patient's satisfaction.     Pricilla Riffle. Fuller Plan MD

## 2013-06-11 NOTE — Consult Note (Addendum)
Reason for Consult:Evaluate presumed gastric outlet obstruction and pancreatic mass  Referring Physician: Steed Kanaan is an 48 y.o. male.   HPI: This is a 48 year old Caucasian male. He was transferred from the [facility] emergency room last night for evaluation of presumed gastric outlet obstruction and a small pancreatic mass.  By history, he has a history of peptic ulcer disease documented by EGD 3 years ago or so. He has undergone esophageal dilatation by Dr. Laural Golden in [place]. He had a large hiatal hernia, probably type III, and had a laparoscopic reduction and repair in [place] 3 years ago. . This recurred 3 months later and another surgeon performed an open hiatal hernia repair. Since that second operation he has had a 70 pound weight loss, vomits food and drink. Occasionally sees some green emesis but mostly nonbilious. Over the past 2 years, despite the weight loss and vomiting, he has not had upper endoscopy or colonoscopy. He has apparently never had a colonoscopy. He has been found to be anemic and is starting a blood transfusion at this time.  I have reviewed his current CT scan and his all prior CT scans with radiology this morning. His old CT scans did show a large type III hiatal hernia but there was no pancreatic mass. This is about 3 years ago. Current CT scan shows  distention of the stomach all the way down to the duodenal bulb. No contrast passes the duodenal bulb. No apparent delayed films. There is no mass or adenopathy there and the cause of the obstruction is not clear. On the anterior surface of the pancreas there is a 2 cm hypodense lesion. This is very smooth. Does not look very invasive. This is very anterior and does not appear to be the cause of the obstruction.  Comorbidities include peptic ulcer disease, esophageal stricture, hypertension. Social history reveals that he is a been disabled for over 10 years after a fall. He has ADHD. Family  history shows congestive heart failure mother and stroke in father. No history of gastric, pancreatic, or colon cancer.  Past Medical History  Diagnosis Date  . GERD (gastroesophageal reflux disease)   . Hypertension   . Heart murmur   . Hiatal hernia   . Migraines     Past Surgical History  Procedure Laterality Date  . Left inguinal hernia repair      History reviewed. No pertinent family history.  Social History:  reports that he has never smoked. He does not have any smokeless tobacco history on file. He reports that he does not drink alcohol or use illicit drugs.  Allergies:  Allergies  Allergen Reactions  . Darvocet [Propoxyphene N-Acetaminophen]   . Nsaids     Ulcers  . Tramadol Nausea And Vomiting    Medications:  Prior to Admission:  Prescriptions prior to admission  Medication Sig Dispense Refill  . acetaminophen (TYLENOL) 325 MG tablet Take 650 mg by mouth every 6 (six) hours as needed for mild pain, fever or headache.      . cloNIDine (CATAPRES) 0.1 MG tablet Take 0.1 mg by mouth 2 (two) times daily.        Marland Kitchen esomeprazole (NEXIUM) 40 MG capsule Take 40 mg by mouth 2 (two) times daily before a meal.        Results for orders placed during the hospital encounter of 06/11/13 (from the past 48 hour(s))  RETICULOCYTES     Status: Abnormal   Collection Time    06/11/13  3:54 AM      Result Value Ref Range   Retic Ct Pct 1.4  0.4 - 3.1 %   RBC. 3.99 (*) 4.22 - 5.81 MIL/uL   Retic Count, Manual 55.9  19.0 - 186.0 K/uL  COMPREHENSIVE METABOLIC PANEL     Status: Abnormal   Collection Time    06/11/13  3:54 AM      Result Value Ref Range   Sodium 143  137 - 147 mEq/L   Potassium 3.3 (*) 3.7 - 5.3 mEq/L   Chloride 102  96 - 112 mEq/L   CO2 28  19 - 32 mEq/L   Glucose, Bld 91  70 - 99 mg/dL   BUN 9  6 - 23 mg/dL   Creatinine, Ser 0.81  0.50 - 1.35 mg/dL   Calcium 8.6  8.4 - 10.5 mg/dL   Total Protein 6.2  6.0 - 8.3 g/dL   Albumin 3.1 (*) 3.5 - 5.2 g/dL   AST  14  0 - 37 U/L   ALT 7  0 - 53 U/L   Alkaline Phosphatase 61  39 - 117 U/L   Total Bilirubin 0.3  0.3 - 1.2 mg/dL   GFR calc non Af Amer >90  >90 mL/min   GFR calc Af Amer >90  >90 mL/min   Comment: (NOTE)     The eGFR has been calculated using the CKD EPI equation.     This calculation has not been validated in all clinical situations.     eGFR's persistently <90 mL/min signify possible Chronic Kidney     Disease.  CBC     Status: Abnormal   Collection Time    06/11/13  3:54 AM      Result Value Ref Range   WBC 6.9  4.0 - 10.5 K/uL   RBC 3.99 (*) 4.22 - 5.81 MIL/uL   Hemoglobin 6.8 (*) 13.0 - 17.0 g/dL   Comment: REPEATED TO VERIFY     CRITICAL RESULT CALLED TO, READ BACK BY AND VERIFIED WITH:     KAREN YOUNG RN AT 0715 06/11/13 BY WOOLLENK   HCT 24.9 (*) 39.0 - 52.0 %   MCV 62.4 (*) 78.0 - 100.0 fL   MCH 17.0 (*) 26.0 - 34.0 pg   MCHC 27.3 (*) 30.0 - 36.0 g/dL   RDW 19.5 (*) 11.5 - 15.5 %   Platelets 577 (*) 150 - 400 K/uL  MAGNESIUM     Status: None   Collection Time    06/11/13  3:54 AM      Result Value Ref Range   Magnesium 2.1  1.5 - 2.5 mg/dL  TYPE AND SCREEN     Status: None   Collection Time    06/11/13  5:50 AM      Result Value Ref Range   ABO/RH(D) O POS     Antibody Screen NEG     Sample Expiration 06/14/2013     Unit Number [id number]     Blood Component Type RBC LR PHER2     Unit division 00     Status of Unit ISSUED     Transfusion Status OK TO TRANSFUSE     Crossmatch Result Compatible    ABO/RH     Status: None   Collection Time    06/11/13  5:50 AM      Result Value Ref Range   ABO/RH(D) O POS    PREPARE RBC (CROSSMATCH)     Status: None   Collection Time  06/11/13  7:47 AM      Result Value Ref Range   Order Confirmation ORDER PROCESSED BY BLOOD BANK      No results found.  ROS  12 system review of systems is performed and is negative except as described above.   Blood pressure 136/93, pulse 73, temperature 97.9 F (36.6 C),  temperature source Oral, resp. rate 18, height 5' 3" (1.6 m), weight 112 lb (50.803 kg), SpO2 99.00%. Physical Exam General: Thin. Alert. In no acute physical distress. Seems frustrated. Nurses are starting blood transfusion. Eyes: Sclerae clear. Extraocular movements  intact Oropharynx: No oral or pharyngeal lesions noted. Neck: Supple. Nontender. Thyroid not enlarged. No adenopathy. No jugular venous distention Lungs: Clear auscultation bilaterally. The chest wall tenderness Heart: Regular rate and rhythm. No murmur. No ectopy Abdomen: Soft. Not distended or tympanitic. Upper midline scar well healed. No hernias. Left inguinal scar. No inguinal mass or hernia. Good femoral pulses Extremities: Moves all 4 chimneys to commands without obvious motor or sensory deficits Neurologic: Alert and oriented. Speech normal. 4 chimneys to command with normal and symmetrical strength  Assessment/Plan:  Apparent gastric outlet obstruction, at level of the duodenal bulb. Etiology is not clear but absence of mass makes benign etiology more likely. Question chronic peptic ulcer disease with stricture. Neoplasia possible but less likely. Gastroparesis and motility disorder seem less likely, although he may have had a vagal nerve injury with his prior surgery.  2 cm hypodense pancreatic mass, very anterior on surface of the pancreas at the  junction of the body and head. This is not causing the obstruction. No adenopathy. Etiology unclear. Tissue diagnosis indicated since this is a new finding.  Protein calorie malnutrition with albumin 3.1. 70 pound weight loss in 2 years. If he requires a surgical intervention, consideration should be given to preoperative nutritional support with TNA  Anemia, hemoglobin 6.8. Agree with transfusion.  Hypertension  History of esophageal stricture. Advise that we get the endoscopy reports from Dr. Laural Golden in [place](done)  GERD, history of 2 operations for hiatal hernia. Await  operative records from [facility] [place].    Will follow.     Edsel Petrin. Dalbert Batman, M.D., [facility] General and Minimally invasive Surgery Breast and Colorectal Surgery Office:   [phone] Pager:   [phone]  06/11/2013, 9:45 AM

## 2013-06-11 NOTE — Progress Notes (Addendum)
Patient admitted after midnight.  Patient interviewed and examined.  [facility] records and [facility] records reviewed.  Pt requests more pain meds. Denies EtOH or drugs, chronic opiates. Cant remember name of PCP in [employer]. Surgeon is Dr. Arlyss Gandy. Pt reports surgeon fired him. Will get records.  Increase dilaudid to 1-2. Transfuse, continue PPI and npo. Workup and consults pending. Also check Ca 19-9, UDS.  (note: pt illiterate)  Doree Barthel, M.D.

## 2013-06-11 NOTE — Op Note (Signed)
[facility] [place], [id number]   ENDOSCOPY PROCEDURE REPORT  PATIENT: [place], Godman  MR#: [id number] BIRTHDATE: 1965-09-27 , 47  yrs. old GENDER: Male ENDOSCOPIST: Ladene Artist, MD, [facility] REFERRED BY:  [facility] PROCEDURE DATE:  06/11/2013 PROCEDURE:  EGD, diagnostic ASA CLASS:     Class II INDICATIONS:  Nausea.   Vomiting.   Iron deficiency anemia.   Weight loss.   abnormal CT of the GI tract. MEDICATIONS: These medications were titrated to patient response per physician's verbal order, Fentanyl 50 mcg IV, and Versed 5 mg IV TOPICAL ANESTHETIC: Cetacaine Spray DESCRIPTION OF PROCEDURE: After the risks benefits and alternatives of the procedure were thoroughly explained, informed consent was obtained.  The Pentax Gastroscope [id number] endoscope was introduced through the mouth and advanced to the second portion of the duodenum. Without limitations.  The instrument was slowly withdrawn as the mucosa was fully examined.  ESOPHAGUS: There was LA Class D esophagitis noted.   The esophagus was otherwise normal. STOMACH: Retained fluid with some solids.   The stomach otherwise appeared normal.   Pylorus appeared normal and patent. DUODENUM: The duodenal mucosa showed no abnormalities in the bulb and second portion of the duodenum.  Retroflexed views revealed a 4-5 cm hiatal hernia.  The scope was then withdrawn from the patient and the procedure completed.  COMPLICATIONS: There were no complications. ENDOSCOPIC IMPRESSION: 1.   LA Class D esophagitis noted 2.   Retained gastric fluid with some solids. 3.   Hiatal hernia  RECOMMENDATIONS: 1.  Anti-reflux regimen long term 2.  PPI bid long term 3.  Metoclopramide for possible gastroparesis  eSigned:  Ladene Artist, MD, Marval Regal 06/11/2013 1:46 PM   CC: Hildred Laser, MD

## 2013-06-11 NOTE — H&P (Addendum)
[facility] History and Physical  Shawn Ayala ZJQ:[id number] DOB: January 16, 1966 DOA: 06/11/2013  Referring physician:Eugene Ayala Mac at [facility] PCP: Shawn Ayala.   Chief Complaint: abdominal pain, vomiting  HPI: Shawn Ayala is a 48 y.o. male with history of hiatal hernia that was repaired approximately a year and a half ago in [place]. Patient reports that since that surgery, things have "not been right".  He has had chronic/intermittent epigastric abdominal pain with vomiting.  He has had an unintentional weight loss of approximately 70lbs in the last year and a half.  He reports that Shawn matter what he eats, solids or liquids, he ends up throwing it up.  He also reports noticing "kind of dark" stools for the last several weeks. He has not noticed any hematochezia.  Occasionally his vomitus contains blood. He is feeling increasingly fatigued and weak. He is frustrated in the persistence of his symptoms. He was evaluated at [facility] where CT abdomen indicated a gastric outlet obstruction and possible pancreatic mass. Hgb was checked and found to be low at 7.4 with MCV of 58. Baseline hemoglobin runs closer to 12-13. Case was discussed with general surgery at [facility], but due to lack of GI coverage there, recommendations were to transfer to [facility]. Case was discussed by EDP with Dr. Fuller Plan with GI who agreed to see the patient in consultation.   Review of Systems:  Pertinent positives as per HPI, otherwise negative  Past Medical History  Diagnosis Date  . GERD (gastroesophageal reflux disease)   . Hypertension   . Heart murmur   . Hiatal hernia   . Migraines    Past Surgical History  Procedure Laterality Date  . Left inguinal hernia repair     Social History:  reports that he has never smoked. He does not have any smokeless tobacco history on Ayala. He reports that he does not drink alcohol or use illicit drugs.  Allergies  Allergen  Reactions  . Darvocet [Propoxyphene N-Acetaminophen]   . Tramadol     Family History: positive for hypertension   Prior to Admission medications   Medication Sig Start Date End Date Taking? Authorizing Provider  cloNIDine (CATAPRES) 0.1 MG tablet Take 0.1 mg by mouth 2 (two) times daily.      Historical Provider, MD  omeprazole (PRILOSEC) 20 MG capsule TAKE 1 CAPSULES BY MOUTH TWICE DAILY (30 MINUTES BEFORE BREAKFAST AND EVERY MEALS ) 09/01/11   Rogene Houston, MD   Physical Exam: Filed Vitals:   06/11/13 0230  BP: 142/96  Pulse: 76  Temp: 98.4 F (36.9 C)  Resp: 18    BP 142/96  Pulse 76  Temp(Src) 98.4 F (36.9 C) (Oral)  Resp 18  SpO2 98%  General:  Appears calm and comfortable Eyes: PERRL, normal lids, irises & conjunctiva ENT: grossly normal hearing, lips & tongue Neck: Shawn LAD, masses or thyromegaly Cardiovascular: RRR, Shawn m/r/g. Shawn LE edema. Telemetry: SR, Shawn arrhythmias  Respiratory: CTA bilaterally, Shawn w/r/r. Normal respiratory effort. Abdomen: soft, tender in periumbilical area, nondistended bs+ Skin: Shawn rash or induration seen on limited exam Musculoskeletal: grossly normal tone BUE/BLE Psychiatric: grossly normal mood and affect, speech fluent and appropriate Neurologic: grossly non-focal.          Labs on Admission:  Basic Metabolic Panel: Shawn results found for this basename: NA, K, CL, CO2, GLUCOSE, BUN, CREATININE, CALCIUM, MG, PHOS,  in the last 168 hours Liver Function Tests: Shawn results found for this basename:  AST, ALT, ALKPHOS, BILITOT, PROT, ALBUMIN,  in the last 168 hours Shawn results found for this basename: LIPASE, AMYLASE,  in the last 168 hours Shawn results found for this basename: AMMONIA,  in the last 168 hours CBC: Shawn results found for this basename: WBC, NEUTROABS, HGB, HCT, MCV, PLT,  in the last 168 hours Cardiac Enzymes: Shawn results found for this basename: CKTOTAL, CKMB, CKMBINDEX, TROPONINI,  in the last 168 hours  BNP (last 3  results) Shawn results found for this basename: PROBNP,  in the last 8760 hours CBG: Shawn results found for this basename: GLUCAP,  in the last 168 hours  Radiological Exams on Admission: Shawn results found.  Assessment/Plan Active Problems:   Hypertension   GERD (gastroesophageal reflux disease)   Anemia due to chronic blood loss   Gastric outlet obstruction   Hypokalemia   Abdominal pain, chronic, epigastric   Nausea with vomiting   Pancreatic mass   1. Gastric outlet obstruction. Will keep the patient NPO except meds for now. NG tube was attempted at [facility] but patient could not tolerate it. He is not vomiting at this time. Will await further recommendations from GI.  I have also informed Dr. Donne Hazel with general surgery who will see the patient in consult. 2. Anemia, likely related to chronic blood loss.  Type and screen.  Transfuse for hemoglobin <7. Anemia panel. Start on protonix BID.  May need endoscopy. 3. Pancreatic mass. May benefit from EUS, will defer to GI.   4. Hypokalemia. Replace. Check magnesium 5. HTN.  Continue outpatient regimen.  Gastroenterology, Dr. Fuller Plan General surgery, Dr. Donne Hazel  Code Status: full code Family Communication: discussed with patient, Shawn family present Disposition Plan: Discharge home once improved  Time spent: 15mins  MEMON,JEHANZEB [facility] Pager [phone]

## 2013-06-11 NOTE — Progress Notes (Signed)
CRITICAL VALUE ALERT  Critical value received:  Hgb= 6.8  Date of notification: 06/11/13  Time of notification:  0710  Critical value read back:yes  Nurse who received alert:  Murriel Hopper RN  MD notified (1st page):  Dr. Conley Canal  Time of first page:  0715  MD notified (2nd page):  Time of second page:  Responding MD:  Dr. Conley Canal  Time MD responded: [phone]

## 2013-06-11 NOTE — Progress Notes (Signed)
Utilization review completed.  

## 2013-06-12 ENCOUNTER — Encounter (HOSPITAL_COMMUNITY): Payer: Self-pay | Admitting: Gastroenterology

## 2013-06-12 DIAGNOSIS — D649 Anemia, unspecified: Secondary | ICD-10-CM

## 2013-06-12 DIAGNOSIS — K219 Gastro-esophageal reflux disease without esophagitis: Secondary | ICD-10-CM

## 2013-06-12 DIAGNOSIS — D509 Iron deficiency anemia, unspecified: Secondary | ICD-10-CM

## 2013-06-12 DIAGNOSIS — K209 Esophagitis, unspecified without bleeding: Secondary | ICD-10-CM

## 2013-06-12 DIAGNOSIS — F1910 Other psychoactive substance abuse, uncomplicated: Secondary | ICD-10-CM

## 2013-06-12 DIAGNOSIS — E46 Unspecified protein-calorie malnutrition: Secondary | ICD-10-CM

## 2013-06-12 DIAGNOSIS — K3184 Gastroparesis: Principal | ICD-10-CM

## 2013-06-12 LAB — CBC
HCT: 24.9 % — ABNORMAL LOW (ref 39.0–52.0)
Hemoglobin: 6.8 g/dL — CL (ref 13.0–17.0)
MCH: 17.5 pg — ABNORMAL LOW (ref 26.0–34.0)
MCHC: 27.3 g/dL — ABNORMAL LOW (ref 30.0–36.0)
MCV: 64.2 fL — ABNORMAL LOW (ref 78.0–100.0)
Platelets: 417 10*3/uL — ABNORMAL HIGH (ref 150–400)
RBC: 3.88 MIL/uL — AB (ref 4.22–5.81)
RDW: 19.7 % — ABNORMAL HIGH (ref 11.5–15.5)
WBC: 6 10*3/uL (ref 4.0–10.5)

## 2013-06-12 LAB — BASIC METABOLIC PANEL
BUN: 7 mg/dL (ref 6–23)
CALCIUM: 8.1 mg/dL — AB (ref 8.4–10.5)
CO2: 28 meq/L (ref 19–32)
Chloride: 104 mEq/L (ref 96–112)
Creatinine, Ser: 0.83 mg/dL (ref 0.50–1.35)
Glucose, Bld: 95 mg/dL (ref 70–99)
Potassium: 4 mEq/L (ref 3.7–5.3)
SODIUM: 140 meq/L (ref 137–147)

## 2013-06-12 LAB — OCCULT BLOOD X 1 CARD TO LAB, STOOL: FECAL OCCULT BLD: NEGATIVE

## 2013-06-12 LAB — CANCER ANTIGEN 19-9: CA 19-9: <1.2 U/mL — ABNORMAL LOW (ref <35.0)

## 2013-06-12 LAB — PREPARE RBC (CROSSMATCH)

## 2013-06-12 MED ORDER — ZOLPIDEM TARTRATE 5 MG PO TABS
5.0000 mg | ORAL_TABLET | Freq: Once | ORAL | Status: AC
  Administered 2013-06-12: 5 mg via ORAL
  Filled 2013-06-12: qty 1

## 2013-06-12 MED ORDER — BENZTROPINE MESYLATE 1 MG/ML IJ SOLN
2.0000 mg | Freq: Once | INTRAMUSCULAR | Status: AC
  Administered 2013-06-12: 2 mg via INTRAVENOUS
  Filled 2013-06-12: qty 2

## 2013-06-12 MED ORDER — METOCLOPRAMIDE HCL 10 MG PO TABS: 10.0000 mg | ORAL_TABLET | Freq: Three times a day (TID) | ORAL | Status: DC

## 2013-06-12 MED ORDER — HYDROMORPHONE HCL PF 1 MG/ML IJ SOLN
1.0000 mg | INTRAMUSCULAR | Status: DC | PRN
  Administered 2013-06-12 – 2013-06-14 (×9): 1 mg via INTRAVENOUS
  Filled 2013-06-12 (×9): qty 1

## 2013-06-12 MED ORDER — BENZTROPINE MESYLATE 1 MG PO TABS
1.0000 mg | ORAL_TABLET | Freq: Two times a day (BID) | ORAL | Status: AC
  Administered 2013-06-12 – 2013-06-13 (×3): 1 mg via ORAL
  Filled 2013-06-12 (×3): qty 1

## 2013-06-12 MED ORDER — PANTOPRAZOLE SODIUM 40 MG PO TBEC
40.0000 mg | DELAYED_RELEASE_TABLET | Freq: Two times a day (BID) | ORAL | Status: DC
  Administered 2013-06-12 – 2013-06-16 (×8): 40 mg via ORAL
  Filled 2013-06-12 (×8): qty 1

## 2013-06-12 MED ORDER — FERUMOXYTOL INJECTION 510 MG/17 ML
510.0000 mg | Freq: Once | INTRAVENOUS | Status: AC
  Administered 2013-06-12: 510 mg via INTRAVENOUS
  Filled 2013-06-12: qty 17

## 2013-06-12 MED ORDER — ZOLPIDEM TARTRATE 5 MG PO TABS
5.0000 mg | ORAL_TABLET | Freq: Every evening | ORAL | Status: DC | PRN
  Administered 2013-06-12 – 2013-06-15 (×4): 5 mg via ORAL
  Filled 2013-06-12 (×4): qty 1

## 2013-06-12 NOTE — Progress Notes (Addendum)
Discussed with Ms Shawn Ayala.   [facility] PROGRESS NOTE  Shawn Ayala TIR:[id number] DOB: 1965-08-03 DOA: 06/11/2013 PCP: No primary provider on file.  Assessment/Plan:  Principal Problem:  Probable Gastroparesis:  No GOO on EGD. not diabetic. Suspect due to opiates. Patient denies using chronic opiates, but yesterday, wife told me they both "go to different doctors" to get pain medicine. got opiates from somewhere a month ago.  Will decrease dilaudid. Could also be vagus nerve damage from H. Hernia surgery.   Advance diet to fulls. Change meds to PO Active Problems:   Hypertension   GERD (gastroesophageal reflux disease)   Anemia, iron deficiency: stool heme negative. Getting another unit of blood (2 total thus far). Ferritin 1. Ordered IV iron as well. Will needs colonoscopy at some point   Hypokalemia corrected   Abdominal pain, chronic, epigastric Severe esophagitis:  Change PPI to PO   Pancreatic mass: for MRCP   Loss of weight   Substance abuse: admits to marijuana, not chronic opiates or cocaine Increase activity  Code Status:  full Family Communication:  Wife 3/22 Disposition Plan:  home  Consultants:  GI  CCS  Procedures:   EGD  Antibiotics:    HPI/Subjective: Tolerating clears. Having stools. No bleeding.  Still with pain.  Objective: Filed Vitals:   06/12/13 1339  BP: 122/80  Pulse: 66  Temp: 97.7 F (36.5 C)  Resp: 16    Intake/Output Summary (Last 24 hours) at 06/12/13 1359 Last data filed at 06/12/13 1245  Gross per 24 hour  Intake 1282.5 ml  Output    626 ml  Net  656.5 ml   Filed Weights   06/11/13 0549  Weight: 50.803 kg (112 lb)    Exam:   General:  Comfortable in bed  Cardiovascular: RRR without MGR  Respiratory: CTA without WRR  Abdomen: S, NT, ND when distracted  Ext: no cce  Psych: calm and cooperative  Basic Metabolic Panel:  Recent Labs Lab 06/11/13 0354 06/12/13 0617  NA 143 140  K 3.3* 4.0  CL 102  104  CO2 28 28  GLUCOSE 91 95  BUN 9 7  CREATININE 0.81 0.83  CALCIUM 8.6 8.1*  MG 2.1  --    Liver Function Tests:  Recent Labs Lab 06/11/13 0354  AST 14  ALT 7  ALKPHOS 61  BILITOT 0.3  PROT 6.2  ALBUMIN 3.1*   No results found for this basename: LIPASE, AMYLASE,  in the last 168 hours No results found for this basename: AMMONIA,  in the last 168 hours CBC:  Recent Labs Lab 06/11/13 0354 06/12/13 0617  WBC 6.9 6.0  HGB 6.8* 6.8*  HCT 24.9* 24.9*  MCV 62.4* 64.2*  PLT 577* 417*   Cardiac Enzymes: No results found for this basename: CKTOTAL, CKMB, CKMBINDEX, TROPONINI,  in the last 168 hours BNP (last 3 results) No results found for this basename: PROBNP,  in the last 8760 hours CBG: No results found for this basename: GLUCAP,  in the last 168 hours  No results found for this or any previous visit (from the past 240 hour(s)).   Studies: No results found.  Scheduled Meds: . cloNIDine  0.1 mg Oral BID  . metoCLOPramide (REGLAN) injection  10 mg Intravenous 4 times per day  . pantoprazole (PROTONIX) IV  40 mg Intravenous Q12H   Continuous Infusions: . sodium chloride 75 mL/hr at 06/11/13 0429    Time spent: 35 minutes  [facility] Pager [phone].  If 7PM-7AM, please contact night-coverage at [email or web address], password [facility] 06/12/2013, 1:59 PM  LOS: 1 day

## 2013-06-12 NOTE — Progress Notes (Signed)
Daily Rounding Note  06/12/2013, 10:09 AM  LOS: 1 day   SUBJECTIVE:       Says he can accommodate smaller volumes of clears, this is not causing increase in abd pain.  Abd pain persists.  Some loose stool and passing flatus, was having few BMs PTA. Says he did not return to see Dr Laural Golden, as his medical insurance (Medicare/medicaid) demands he see providers in [place], his home state. And he could not afford outpt fees.  Admits to marijuana but not to cocaine or opiates.   OBJECTIVE:         Vital signs in last 24 hours:    Temp:  [97.6 F (36.4 C)-98.2 F (36.8 C)] 98.2 F (36.8 C) (03/23 0515) Pulse Rate:  [63-95] 63 (03/23 0515) Resp:  [12-22] 17 (03/23 0515) BP: (99-161)/(46-104) 99/46 mmHg (03/23 0515) SpO2:  [98 %-100 %] 99 % (03/23 0515) Last BM Date: 06/11/13 General: looks pale and sickly.  No acute distress   Heart: RRR Chest: very dimininshed, poor resp effort.  No cough or dyspnea Abdomen: soft, ND.  Hyperactive BS (heard also on lung exam).  Tender on LLQ. No guard or rebound  Extremities: no CCE Neuro/Psych:  Cooperative, depressed.   Intake/Output from previous day: 03/22 0701 - 03/23 0700 In: 531 [I.V.:250; Blood:281] Out: 176 [Urine:175; Stool:1]  Intake/Output this shift: Total I/O In: 720 [P.O.:720] Out: 525 [Urine:525]  Lab Results:  Recent Labs  06/11/13 0354 06/12/13 0617  WBC 6.9 6.0  HGB 6.8* 6.8*  HCT 24.9* 24.9*  PLT 577* 417*   BMET  Recent Labs  06/11/13 0354 06/12/13 0617  NA 143 140  K 3.3* 4.0  CL 102 104  CO2 28 28  GLUCOSE 91 95  BUN 9 7  CREATININE 0.81 0.83  CALCIUM 8.6 8.1*   LFT  Recent Labs  06/11/13 0354  PROT 6.2  ALBUMIN 3.1*  AST 14  ALT 7  ALKPHOS 61  BILITOT 0.3    Studies/Results: No results found. Scheduled Meds: . cloNIDine  0.1 mg Oral BID  . metoCLOPramide (REGLAN) injection  10 mg Intravenous 4 times per day  . pantoprazole  (PROTONIX) IV  40 mg Intravenous Q12H   Continuous Infusions: . sodium chloride 75 mL/hr at 06/11/13 0429   PRN Meds:.acetaminophen, acetaminophen, HYDROmorphone (DILAUDID) injection, ondansetron (ZOFRAN) IV, ondansetron    ASSESMENT:   *  N/V, weight loss since second Nissen. GOO per imaging.  Gastric ulcer on EGD by Dr Marissa Nestle in [place] 05/2010. Clotest +. 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 Pathology neg for H Pylori.  S/p Nissen fundoplication ~3419/3790, redo nissen 3 months later.  EGD 3/22: severe esophagitis, recurrent HH, retained gastric contents.  On IV BID Protonix and qid IV Reglan.  *  Anemia, iron deficient. Received feraheme on 3/23. PRBC x one on 3/22.   *  Hypodense, smooth, 2 cm lesion of anterior pancreas on CT scan.   *  Depression.  Tox screen + for cocaine, opiates and THC. No opiates on PTA med list, may have received at [facility].  Admits to marijuana only.  Wonder about narcotics use given the gastroparesis and if hx of abuse, may explain why surgeon, Dr Davina Poke in [place] refuses to see pt.   PLAN   *  Plan is to transfer one  more unit PRBC *  If not able to perform EUS before discharge, ? Conley Canal is wondering about getting MRCP?  She also wonders if he needs colonoscopy (prep for that may be challenging) *  Advance to full liquids.  *  Dr Conley Canal requested surgical records from  [place].   Addendum 1:26 PM.  CT reviewed with Dr Ardis Hughs.  His rec is to obtain MRI with MRCP to further assess pancreas lesion and determined future follow up needs. I ordered MRI.  CT images are in radiolgists files, as they read [facility] images.   Azucena Freed  06/12/2013, 10:09 AM Pager: [phone]  [facility] Attending  I have also seen and assessed the patient and agree with the above note. He has  gastroparesis - could be from vagal nerve injury. Narcotics also could be playing a role. Await MRI/MRCP of pancreatic lesion. Then likely colonoscopy Wed re: iron deficiency anemia.  Gatha Mayer, MD, [facility] [phone] (pager) 06/12/2013 4:18 PM

## 2013-06-12 NOTE — Progress Notes (Signed)
Called by RN about "tongue and jaw spasm" from Reglan. Similar dystonic reaction in the past. Will d/c reglan. Cogentin IV stat and PO for 24 hours.

## 2013-06-12 NOTE — Progress Notes (Signed)
No GOO on EGD. Trying fulls. Patient examined and I agree with the assessment and plan  Georganna Skeans, MD, MPH, FACS Trauma: [phone] General Surgery: [phone]  06/12/2013 6:05 PM

## 2013-06-12 NOTE — Progress Notes (Signed)
Patient ID: Shawn Ayala, male   DOB: [birth year], 48 y.o.   MRN: [id number]  Subjective: Denies nausea or vomiting, tolerating clears.  Continues to have abdominal pain.  +flatus, BM yesterday, unsure if melanotic.    Objective:  Vital signs:  Filed Vitals:   06/11/13 1355 06/11/13 1427 06/11/13 2156 06/12/13 0515  BP: 118/66 128/68 124/76 99/46  Pulse: 70 68 75 63  Temp:  97.8 F (36.6 C) 97.6 F (36.4 C) 98.2 F (36.8 C)  TempSrc:  Oral Oral Oral  Resp: _0 Height:      Weight:      SpO2: 100% 100% 99% 99%    Last BM Date: 06/11/13  Intake/Output   Yesterday:  03/22 0701 - 03/23 0700 In: 531 [I.V.:250; Blood:281] Out: 176 [Urine:175; Stool:1] This shift:    I/O last 3 completed shifts: In: 531 [I.V.:250; Blood:281] Out: 176 [Urine:175; Stool:1]     Physical Exam: General: Pt awake/alert/oriented x3 in no acute distress Abdomen: Soft.  Nondistended. TTP to epigastric region.  No evidence of peritonitis.  No incarcerated hernias.  Problem List:   Active Problems:   Hypertension   GERD (gastroesophageal reflux disease)   Anemia due to chronic blood loss   Gastric outlet obstruction   Hypokalemia   Abdominal pain, chronic, epigastric   Nausea with vomiting   Pancreatic mass   Loss of weight    Results:   Labs: Results for orders placed during the hospital encounter of 06/11/13 (from the past 48 hour(s))  VITAMIN B12     Status: None   Collection Time    06/11/13  3:54 AM      Result Value Ref Range   Vitamin B-12 302  211 - 911 pg/mL   Comment: Performed at [place]     Status: None   Collection Time    06/11/13  3:54 AM      Result Value Ref Range   Folate >20.0     Comment: (NOTE)     Reference Ranges            Deficient:       0.4 - 3.3 ng/mL            Indeterminate:   3.4 - 5.4 ng/mL            Normal:              > 5.4 ng/mL     Performed at [facility]     Status: Abnormal   Collection Time    06/11/13  3:54 AM      Result Value Ref Range   Iron 21 (*) 42 - 135 ug/dL   TIBC 409  215 - 435 ug/dL   Saturation Ratios 5 (*) 20 - 55 %   UIBC 388  125 - 400 ug/dL   Comment: Performed at [place]     Status: Abnormal   Collection Time    06/11/13  3:54 AM      Result Value Ref Range   Ferritin 1 (*) 22 - 322 ng/mL   Comment: Performed at [place]     Status: Abnormal   Collection Time    06/11/13  3:54 AM      Result Value Ref Range   Retic Ct Pct 1.4  0.4 - 3.1 %   RBC. 3.99 (*) 4.22 - 5.81 MIL/uL  Retic Count, Manual 55.9  19.0 - 186.0 K/uL  COMPREHENSIVE METABOLIC PANEL     Status: Abnormal   Collection Time    06/11/13  3:54 AM      Result Value Ref Range   Sodium 143  137 - 147 mEq/L   Potassium 3.3 (*) 3.7 - 5.3 mEq/L   Chloride 102  96 - 112 mEq/L   CO2 28  19 - 32 mEq/L   Glucose, Bld 91  70 - 99 mg/dL   BUN 9  6 - 23 mg/dL   Creatinine, Ser 0.81  0.50 - 1.35 mg/dL   Calcium 8.6  8.4 - 10.5 mg/dL   Total Protein 6.2  6.0 - 8.3 g/dL   Albumin 3.1 (*) 3.5 - 5.2 g/dL   AST 14  0 - 37 U/L   ALT 7  0 - 53 U/L   Alkaline Phosphatase 61  39 - 117 U/L   Total Bilirubin 0.3  0.3 - 1.2 mg/dL   GFR calc non Af Amer >90  >90 mL/min   GFR calc Af Amer >90  >90 mL/min   Comment: (NOTE)     The eGFR has been calculated using the CKD EPI equation.     This calculation has not been validated in all clinical situations.     eGFR's persistently <90 mL/min signify possible Chronic Kidney     Disease.  CBC     Status: Abnormal   Collection Time    06/11/13  3:54 AM      Result Value Ref Range   WBC 6.9  4.0 - 10.5 K/uL   RBC 3.99 (*) 4.22 - 5.81 MIL/uL   Hemoglobin 6.8 (*) 13.0 - 17.0 g/dL   Comment: REPEATED TO VERIFY     CRITICAL RESULT CALLED TO, READ BACK BY AND VERIFIED WITH:     KAREN YOUNG RN AT 0715 06/11/13 BY WOOLLENK   HCT 24.9 (*) 39.0 - 52.0 %   MCV 62.4 (*) 78.0 - 100.0 fL   MCH 17.0  (*) 26.0 - 34.0 pg   MCHC 27.3 (*) 30.0 - 36.0 g/dL   RDW 19.5 (*) 11.5 - 15.5 %   Platelets 577 (*) 150 - 400 K/uL  MAGNESIUM     Status: None   Collection Time    06/11/13  3:54 AM      Result Value Ref Range   Magnesium 2.1  1.5 - 2.5 mg/dL  TSH     Status: None   Collection Time    06/11/13  3:54 AM      Result Value Ref Range   TSH 1.189  0.350 - 4.500 uIU/mL   Comment: Performed at [place]     Status: None   Collection Time    06/11/13  5:50 AM      Result Value Ref Range   ABO/RH(D) O POS     Antibody Screen NEG     Sample Expiration 06/14/2013     Unit Number [id number]     Blood Component Type RBC LR PHER2     Unit division 00     Status of Unit ISSUED     Transfusion Status OK TO TRANSFUSE     Crossmatch Result Compatible     Unit Number [id number]     Blood Component Type RBC LR PHER2     Unit division 00     Status of Unit ALLOCATED     Transfusion Status OK  TO TRANSFUSE     Crossmatch Result Compatible    ABO/RH     Status: None   Collection Time    06/11/13  5:50 AM      Result Value Ref Range   ABO/RH(D) O POS    PREPARE RBC (CROSSMATCH)     Status: None   Collection Time    06/11/13  7:47 AM      Result Value Ref Range   Order Confirmation ORDER PROCESSED BY BLOOD BANK    URINE RAPID DRUG SCREEN (HOSP PERFORMED)     Status: Abnormal   Collection Time    06/11/13  8:54 AM      Result Value Ref Range   Opiates POSITIVE (*) NONE DETECTED   Cocaine POSITIVE (*) NONE DETECTED   Benzodiazepines NONE DETECTED  NONE DETECTED   Amphetamines NONE DETECTED  NONE DETECTED   Tetrahydrocannabinol POSITIVE (*) NONE DETECTED   Barbiturates NONE DETECTED  NONE DETECTED   Comment:            DRUG SCREEN FOR MEDICAL PURPOSES     ONLY.  IF CONFIRMATION IS NEEDED     FOR ANY PURPOSE, NOTIFY LAB     WITHIN 5 DAYS.                LOWEST DETECTABLE LIMITS     FOR URINE DRUG SCREEN     Drug Class       Cutoff (ng/mL)      Amphetamine      1000     Barbiturate      200     Benzodiazepine   161     Tricyclics       096     Opiates          300     Cocaine          300     THC              50  OCCULT BLOOD X 1 CARD TO LAB, STOOL     Status: None   Collection Time    06/12/13 12:11 AM      Result Value Ref Range   Fecal Occult Bld NEGATIVE  NEGATIVE  BASIC METABOLIC PANEL     Status: Abnormal   Collection Time    06/12/13  6:17 AM      Result Value Ref Range   Sodium 140  137 - 147 mEq/L   Potassium 4.0  3.7 - 5.3 mEq/L   Chloride 104  96 - 112 mEq/L   CO2 28  19 - 32 mEq/L   Glucose, Bld 95  70 - 99 mg/dL   BUN 7  6 - 23 mg/dL   Creatinine, Ser 0.83  0.50 - 1.35 mg/dL   Calcium 8.1 (*) 8.4 - 10.5 mg/dL   GFR calc non Af Amer >90  >90 mL/min   GFR calc Af Amer >90  >90 mL/min   Comment: (NOTE)     The eGFR has been calculated using the CKD EPI equation.     This calculation has not been validated in all clinical situations.     eGFR's persistently <90 mL/min signify possible Chronic Kidney     Disease.  CBC     Status: Abnormal   Collection Time    06/12/13  6:17 AM      Result Value Ref Range   WBC 6.0  4.0 - 10.5 K/uL   RBC 3.88 (*)  4.22 - 5.81 MIL/uL   Hemoglobin 6.8 (*) 13.0 - 17.0 g/dL   Comment: CRITICAL VALUE NOTED.  VALUE IS CONSISTENT WITH PREVIOUSLY REPORTED AND CALLED VALUE.   HCT 24.9 (*) 39.0 - 52.0 %   MCV 64.2 (*) 78.0 - 100.0 fL   MCH 17.5 (*) 26.0 - 34.0 pg   MCHC 27.3 (*) 30.0 - 36.0 g/dL   RDW 19.7 (*) 11.5 - 15.5 %   Platelets 417 (*) 150 - 400 K/uL   Comment: REPEATED TO VERIFY     REPEATED TO VERIFY     DELTA CHECK NOTED  PREPARE RBC (CROSSMATCH)     Status: None   Collection Time    06/12/13  7:59 AM      Result Value Ref Range   Order Confirmation ORDER PROCESSED BY BLOOD BANK      Imaging / Studies: No results found.  Medications / Allergies: per chart  Antibiotics: Anti-infectives   None      Assessment/Plan Hx HTN, esophageal stricture, hiatal  hernia repair x2 last being about 2 years ago in [place] +THC/cocaine on UDS PCM Anemia Presumed gastric outlet obstruction 2cm hypodense pancreatic mass -still waiting on medical records -s/p EGD 3/22 which reveal esophagitis, retained gastric content and hiatal hernia, no GOO or ulcer. -may need EUS to evaluate the pancreatic lesion -consider reglan and advancing to full liquid diet -transfuse per primary team -further anemia work up per primary team -surgery will follow  Erby Pian, [facility] Surgery Pager [phone] Office [phone]  06/12/2013 8:16 AM

## 2013-06-13 ENCOUNTER — Inpatient Hospital Stay (HOSPITAL_COMMUNITY): Payer: Medicare Other

## 2013-06-13 LAB — TYPE AND SCREEN
ABO/RH(D): O POS
ANTIBODY SCREEN: NEGATIVE
UNIT DIVISION: 0
UNIT DIVISION: 0

## 2013-06-13 LAB — HEMOGLOBIN AND HEMATOCRIT, BLOOD
HEMATOCRIT: 29.7 % — AB (ref 39.0–52.0)
HEMOGLOBIN: 8.6 g/dL — AB (ref 13.0–17.0)

## 2013-06-13 MED ORDER — PEG-KCL-NACL-NASULF-NA ASC-C 100 G PO SOLR
0.5000 | Freq: Once | ORAL | Status: AC
  Administered 2013-06-13: 100 g via ORAL
  Filled 2013-06-13: qty 1

## 2013-06-13 MED ORDER — PEG-KCL-NACL-NASULF-NA ASC-C 100 G PO SOLR: 1.0000 | Freq: Once | ORAL | Status: DC

## 2013-06-13 MED ORDER — PEG-KCL-NACL-NASULF-NA ASC-C 100 G PO SOLR
0.5000 | Freq: Once | ORAL | Status: AC
  Administered 2013-06-14: 100 g via ORAL

## 2013-06-13 MED ORDER — GADOBENATE DIMEGLUMINE 529 MG/ML IV SOLN
10.0000 mL | Freq: Once | INTRAVENOUS | Status: AC
  Administered 2013-06-13: 10 mL via INTRAVENOUS

## 2013-06-13 NOTE — Progress Notes (Signed)
Daily Rounding Note  06/13/2013, 11:37 AM  LOS: 2 days   SUBJECTIVE:       Still with pain in upper abdomen, central and left side.  Same pain he's had since [facility] surgery.  No nausea.  Pain not worse with po unless he eats to large a volume.  Currently NPO for MRI Large BM yesterday, brown.   OBJECTIVE:         Vital signs in last 24 hours:    Temp:  [97.6 F (36.4 C)-97.9 F (36.6 C)] 97.7 F (36.5 C) (03/24 0503) Pulse Rate:  [57-81] 57 (03/24 1020) Resp:  [16-18] 16 (03/24 1020) BP: (103-136)/(64-92) 124/78 mmHg (03/24 1020) SpO2:  [97 %-100 %] 100 % (03/24 1020) Last BM Date: 06/11/13 General: looks unwell, comfortable   Heart: RRR.  No mrg Chest: clear bil.  No dyspnea Abdomen: soft, tender in epigastrum and LUQ.  No guard or rebound  Extremities: no CCE Neuro/Psych:  Not confused or agitated.  Alert.  No gross deficits.   Intake/Output from previous day: 03/23 0701 - 03/24 0700 In: 2422.5 [P.O.:720; I.V.:1390; Blood:312.5] Out: 2175 [Urine:2175]  Intake/Output this shift: Total I/O In: 360 [I.V.:360] Out: -   Lab Results:  Recent Labs  06/11/13 0354 06/12/13 0617 06/13/13 0428  WBC 6.9 6.0  --   HGB 6.8* 6.8* 8.6*  HCT 24.9* 24.9* 29.7*  PLT 577* 417*  --    BMET  Recent Labs  06/11/13 0354 06/12/13 0617  NA 143 140  K 3.3* 4.0  CL 102 104  CO2 28 28  GLUCOSE 91 95  BUN 9 7  CREATININE 0.81 0.83  CALCIUM 8.6 8.1*   LFT  Recent Labs  06/11/13 0354  PROT 6.2  ALBUMIN 3.1*  AST 14  ALT 7  ALKPHOS 61  BILITOT 0.3    Studies/Results: Waiting on MRI/MRCP  ASSESMENT:   * N/V, weight loss since second Nissen. GOO per imaging.  Gastric ulcer on EGD by Dr Marissa Nestle in [place] 05/2010. Clotest +. 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 Pathology neg for H Pylori.  S/p Nissen fundoplication ~3149/7026, redo nissen 3 months later. Chronic upper abdominal pain since surgery.  EGD 3/22: severe esophagitis, recurrent HH, retained gastric contents.  On IV BID Protonix.  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  * Anemia, iron deficient. Received feraheme on 3/23. PRBC x one on 3/22. Hgb improved.   * Hypodense, smooth, 2 cm lesion of anterior pancreas on CT scan. MRI/MRCP ordered.  * Depression. Tox screen + for cocaine, opiates and THC. No opiates on PTA med list, may have received at [facility]. Admits to marijuana only. Wonder about narcotics use given the gastroparesis and if hx of abuse, may explain why surgeon, Dr Davina Poke in [place] refuses to see pt.      PLAN   *  Await MRI/MRCP *  Colonoscopy tomorrow? So will start clears after MRI    Azucena Freed  06/13/2013, 11:37 AM Pager: [place] Attending  I have also seen and assessed the patient and agree with the above note. MRI pending - reviewed situation with wife and patient. Needs a colonoscopy for iron-deficiency anemia - will do tomorrow. The risks and benefits as well as alternatives of endoscopic procedure(s) have been discussed and  reviewed. All questions answered. The patient agrees to proceed.   Gatha Mayer, MD, [facility] [phone] (pager) 06/13/2013 5:47 PM

## 2013-06-13 NOTE — Progress Notes (Signed)
PROGRESS NOTE  Meryl Ponder MPN:[id number] DOB: [birth year] DOA: 06/11/2013 PCP: Julio Sicks, PA-C  Assessment/Plan:  Principal Problem:  Probable Gastroparesis:  No GOO on EGD. not diabetic. Suspect due to opiates. Patient denies using chronic opiates, but yesterday, wife told me they both "go to different doctors" to get pain medicine. got opiates from somewhere a month ago.  Will decrease dilaudid. Could also be vagus nerve damage from H. Hernia surgery.    - FOR MRCP today due to pancreatic mass -colonoscopy?? Per GI     Hypertension   GERD (gastroesophageal reflux disease)   Anemia, iron deficiency: stool heme negative. S/p 2 units, h/h improved. Ferritin 1. Ordered IV iron as well. Will needs colonoscopy at some point   Hypokalemia corrected   Abdominal pain, chronic, epigastric Severe esophagitis:  Change PPI to PO   Pancreatic mass: for MRCP   Loss of weight   Substance abuse: admits to marijuana, not chronic opiates or cocaine- UDS + cocaine Increase activity  Code Status:  full Family Communication:  Wife 3/22 Disposition Plan:  home  Consultants:  GI  CCS  Procedures:   EGD  Antibiotics:    HPI/Subjective: C/o abd pain  Objective: Filed Vitals:   06/13/13 0503  BP: 127/78  Pulse: 58  Temp: 97.7 F (36.5 C)  Resp: 16    Intake/Output Summary (Last 24 hours) at 06/13/13 0831 Last data filed at 06/13/13 0504  Gross per 24 hour  Intake 2422.5 ml  Output   2175 ml  Net  247.5 ml   Filed Weights   06/11/13 0549  Weight: 50.803 kg (112 lb)    Exam:   General:  Comfortable in bed  Cardiovascular: RRR without MGR  Respiratory: CTA without WRR  Abdomen: S, NT, ND when distracted  Ext: no cce  Psych: calm and cooperative  Basic Metabolic Panel:  Recent Labs Lab 06/11/13 0354 06/12/13 0617  NA 143 140  K 3.3* 4.0  CL 102 104  CO2 28 28  GLUCOSE 91 95  BUN 9 7  CREATININE 0.81 0.83  CALCIUM 8.6 8.1*  MG 2.1  --     Liver Function Tests:  Recent Labs Lab 06/11/13 0354  AST 14  ALT 7  ALKPHOS 61  BILITOT 0.3  PROT 6.2  ALBUMIN 3.1*   No results found for this basename: LIPASE, AMYLASE,  in the last 168 hours No results found for this basename: AMMONIA,  in the last 168 hours CBC:  Recent Labs Lab 06/11/13 0354 06/12/13 0617 06/13/13 0428  WBC 6.9 6.0  --   HGB 6.8* 6.8* 8.6*  HCT 24.9* 24.9* 29.7*  MCV 62.4* 64.2*  --   PLT 577* 417*  --    Cardiac Enzymes: No results found for this basename: CKTOTAL, CKMB, CKMBINDEX, TROPONINI,  in the last 168 hours BNP (last 3 results) No results found for this basename: PROBNP,  in the last 8760 hours CBG: No results found for this basename: GLUCAP,  in the last 168 hours  No results found for this or any previous visit (from the past 240 hour(s)).   Studies: No results found.  Scheduled Meds: . benztropine  1 mg Oral BID  . cloNIDine  0.1 mg Oral BID  . pantoprazole  40 mg Oral BID   Continuous Infusions: . sodium chloride 75 mL/hr at 06/13/13 0086    Time spent: 25 minutes  Eulogio Bear  [facility] Pager [phone]. If 7PM-7AM, please contact night-coverage  at [email or web address], password [facility] 06/13/2013, 8:31 AM  LOS: 2 days

## 2013-06-13 NOTE — Progress Notes (Signed)
No N/V. Await MRCP. Patient examined and I agree with the assessment and plan  Georganna Skeans, MD, MPH, FACS Trauma: [phone] General Surgery: [phone]  06/13/2013 1:54 PM

## 2013-06-13 NOTE — Progress Notes (Signed)
Patient ID: Shawn Ayala, male   DOB: 30-May-1965, 48 y.o.   MRN: [id number]  Subjective: Had a reaction to reglan.  Continues to have abdominal pain, however, tolerated full liquids last night.  Denies nausea or vomiting.  Passing flatus.  H&h up following transfusion.   Objective:  Vital signs:  Filed Vitals:   06/12/13 1339 06/12/13 1442 06/12/13 2031 06/13/13 0503  BP: 122/80 136/86 136/92 127/78  Pulse: 66 77 81 58  Temp: 97.7 F (36.5 C) 97.6 F (36.4 C) 97.7 F (36.5 C) 97.7 F (36.5 C)  TempSrc: Axillary Oral Oral Oral  Resp: '16 16 17 16  ' Height:      Weight:      SpO2: 100% 100% 100% 99%    Last BM Date: 06/11/13  Intake/Output   Yesterday:  03/23 0701 - 03/24 0700 In: 2422.5 [P.O.:720; I.V.:1390; Blood:312.5] Out: 2175 [Urine:2175] This shift:    I/O last 3 completed shifts: In: 2422.5 [P.O.:720; I.V.:1390; Blood:312.5] Out: 2176 [Urine:2175; Stool:1]   Physical Exam:  General: Pt awake/alert/oriented x3 in no acute distress  Abdomen: Soft. Nondistended. TTP to epigastric region. No evidence of peritonitis. No incarcerated hernias.   Problem List:   Principal Problem:   Gastroparesis Active Problems:   Hypertension   GERD (gastroesophageal reflux disease)   Anemia, iron deficiency   Hypokalemia   Abdominal pain, chronic, epigastric   Pancreatic mass   Loss of weight   Substance abuse   Acute esophagitis    Results:   Labs: Results for orders placed during the hospital encounter of 06/11/13 (from the past 48 hour(s))  PREPARE RBC (CROSSMATCH)     Status: None   Collection Time    06/11/13  7:47 AM      Result Value Ref Range   Order Confirmation ORDER PROCESSED BY BLOOD BANK    URINE RAPID DRUG SCREEN (HOSP PERFORMED)     Status: Abnormal   Collection Time    06/11/13  8:54 AM      Result Value Ref Range   Opiates POSITIVE (*) NONE DETECTED   Cocaine POSITIVE (*) NONE DETECTED   Benzodiazepines NONE DETECTED  NONE DETECTED   Amphetamines NONE DETECTED  NONE DETECTED   Tetrahydrocannabinol POSITIVE (*) NONE DETECTED   Barbiturates NONE DETECTED  NONE DETECTED   Comment:            DRUG SCREEN FOR MEDICAL PURPOSES     ONLY.  IF CONFIRMATION IS NEEDED     FOR ANY PURPOSE, NOTIFY LAB     WITHIN 5 DAYS.                LOWEST DETECTABLE LIMITS     FOR URINE DRUG SCREEN     Drug Class       Cutoff (ng/mL)     Amphetamine      1000     Barbiturate      200     Benzodiazepine   552     Tricyclics       080     Opiates          300     Cocaine          300     THC              50  OCCULT BLOOD X 1 CARD TO LAB, STOOL     Status: None   Collection Time    06/12/13 12:11 AM      Result  Value Ref Range   Fecal Occult Bld NEGATIVE  NEGATIVE  BASIC METABOLIC PANEL     Status: Abnormal   Collection Time    06/12/13  6:17 AM      Result Value Ref Range   Sodium 140  137 - 147 mEq/L   Potassium 4.0  3.7 - 5.3 mEq/L   Chloride 104  96 - 112 mEq/L   CO2 28  19 - 32 mEq/L   Glucose, Bld 95  70 - 99 mg/dL   BUN 7  6 - 23 mg/dL   Creatinine, Ser 0.83  0.50 - 1.35 mg/dL   Calcium 8.1 (*) 8.4 - 10.5 mg/dL   GFR calc non Af Amer >90  >90 mL/min   GFR calc Af Amer >90  >90 mL/min   Comment: (NOTE)     The eGFR has been calculated using the CKD EPI equation.     This calculation has not been validated in all clinical situations.     eGFR's persistently <90 mL/min signify possible Chronic Kidney     Disease.  CBC     Status: Abnormal   Collection Time    06/12/13  6:17 AM      Result Value Ref Range   WBC 6.0  4.0 - 10.5 K/uL   RBC 3.88 (*) 4.22 - 5.81 MIL/uL   Hemoglobin 6.8 (*) 13.0 - 17.0 g/dL   Comment: CRITICAL VALUE NOTED.  VALUE IS CONSISTENT WITH PREVIOUSLY REPORTED AND CALLED VALUE.   HCT 24.9 (*) 39.0 - 52.0 %   MCV 64.2 (*) 78.0 - 100.0 fL   MCH 17.5 (*) 26.0 - 34.0 pg   MCHC 27.3 (*) 30.0 - 36.0 g/dL   RDW 19.7 (*) 11.5 - 15.5 %   Platelets 417 (*) 150 - 400 K/uL   Comment: REPEATED TO VERIFY      REPEATED TO VERIFY     DELTA CHECK NOTED  CANCER ANTIGEN 19-9     Status: Abnormal   Collection Time    06/12/13  6:17 AM      Result Value Ref Range   CA 19-9 <1.2 (*) <35.0 U/mL   Comment: Performed at [facility] RBC (CROSSMATCH)     Status: None   Collection Time    06/12/13  7:59 AM      Result Value Ref Range   Order Confirmation ORDER PROCESSED BY BLOOD BANK    HEMOGLOBIN AND HEMATOCRIT, BLOOD     Status: Abnormal   Collection Time    06/13/13  4:28 AM      Result Value Ref Range   Hemoglobin 8.6 (*) 13.0 - 17.0 g/dL   Comment: REPEATED TO VERIFY     POST TRANSFUSION SPECIMEN   HCT 29.7 (*) 39.0 - 52.0 %    Imaging / Studies: No results found.  Medications / Allergies: per chart  Antibiotics: Anti-infectives   None     Assessment/Plan  Hx HTN, esophageal stricture, hiatal hernia repair x2 last being about 2 years ago in [place]  +THC/cocaine on UDS  PCM  Anemia  Gastroparesis, no GOO on EGD 2cm hypodense pancreatic mass MRCP today to evaluate the pancreatic lesion Advance diet as tolerated Will follow  Will follow Erby Pian, [facility] Pager [phone] Office [phone]  06/13/2013 7:39 AM

## 2013-06-14 ENCOUNTER — Encounter (HOSPITAL_COMMUNITY)
Admission: EM | Disposition: A | Discharge status: Home / Self Care | Payer: Self-pay | Source: Other Acute Inpatient Hospital | Attending: Internal Medicine

## 2013-06-14 ENCOUNTER — Encounter (HOSPITAL_COMMUNITY): Payer: Self-pay | Admitting: Gastroenterology

## 2013-06-14 DIAGNOSIS — E876 Hypokalemia: Secondary | ICD-10-CM

## 2013-06-14 DIAGNOSIS — D126 Benign neoplasm of colon, unspecified: Secondary | ICD-10-CM | POA: Diagnosis present

## 2013-06-14 DIAGNOSIS — K5730 Diverticulosis of large intestine without perforation or abscess without bleeding: Secondary | ICD-10-CM | POA: Diagnosis present

## 2013-06-14 HISTORY — PX: COLONOSCOPY: SHX5424

## 2013-06-14 SURGERY — COLONOSCOPY
Anesthesia: Moderate Sedation

## 2013-06-14 MED ORDER — FENTANYL CITRATE 0.05 MG/ML IJ SOLN
INTRAMUSCULAR | Status: DC | PRN
  Administered 2013-06-14 (×3): 25 ug via INTRAVENOUS

## 2013-06-14 MED ORDER — FENTANYL CITRATE 0.05 MG/ML IJ SOLN
INTRAMUSCULAR | Status: AC
  Filled 2013-06-14: qty 2

## 2013-06-14 MED ORDER — MIDAZOLAM HCL 5 MG/ML IJ SOLN
INTRAMUSCULAR | Status: AC
  Filled 2013-06-14: qty 2

## 2013-06-14 MED ORDER — MIDAZOLAM HCL 5 MG/5ML IJ SOLN
INTRAMUSCULAR | Status: DC | PRN
  Administered 2013-06-14: 2 mg via INTRAVENOUS
  Administered 2013-06-14: 1 mg via INTRAVENOUS
  Administered 2013-06-14: 2 mg via INTRAVENOUS
  Administered 2013-06-14: 1 mg via INTRAVENOUS

## 2013-06-14 MED ORDER — HYDROMORPHONE HCL PF 1 MG/ML IJ SOLN
0.5000 mg | INTRAMUSCULAR | Status: DC | PRN
  Administered 2013-06-14 – 2013-06-15 (×7): 0.5 mg via INTRAVENOUS
  Filled 2013-06-14 (×7): qty 1

## 2013-06-14 MED ORDER — SODIUM CHLORIDE 0.9 % IV SOLN
INTRAVENOUS | Status: DC
  Administered 2013-06-14: 500 mL via INTRAVENOUS

## 2013-06-14 NOTE — Progress Notes (Signed)
Agree Romualdo Prosise, MD, MPH, FACS Trauma: [phone] General Surgery: [phone]  

## 2013-06-14 NOTE — Progress Notes (Signed)
To Endo via wheelchair. 

## 2013-06-14 NOTE — Op Note (Signed)
[facility] [place], [id number]   COLONOSCOPY PROCEDURE REPORT  PATIENT: Shawn Ayala, Shawn Ayala  MR#: [id number] BIRTHDATE: 04-09-1965 , 77  yrs. old GENDER: Male ENDOSCOPIST: Gatha Mayer, MD, [facility] PROCEDURE DATE:  06/14/2013 PROCEDURE:   Colonoscopy with snare polypectomy First Screening Colonoscopy - Avg.  risk and is 50 yrs.  old or older - No.  Prior Negative Screening - Now for repeat screening. N/A  History of Adenoma - Now for follow-up colonoscopy & has been > or = to 3 yrs.  N/A  Polyps Removed Today? Yes. ASA CLASS:   Class III INDICATIONS:Iron Deficiency Anemia. MEDICATIONS: Fentanyl 75 mcg IV and Versed 6 mg IV  DESCRIPTION OF PROCEDURE:   After the risks benefits and alternatives of the procedure were thoroughly explained, informed consent was obtained.  A digital rectal exam revealed no abnormalities of the rectum, A digital rectal exam revealed no prostatic nodules, and A digital rectal exam revealed the prostate was not enlarged.   The [employer] Adult Colon [id number]  endoscope was introduced through the anus and advanced to the cecum, which was identified by both the appendix and ileocecal valve. No adverse events experienced.   The quality of the prep was adequate, using MoviPrep  The instrument was then slowly withdrawn as the colon was fully examined.      COLON FINDINGS: A diminutive sessile polyp was found in the rectosigmoid colon.  A polypectomy was performed with a cold snare. The resection was complete and the polyp tissue was completely retrieved.   Mild diverticulosis was noted in the sigmoid colon. The colon mucosa was otherwise normal.  Retroflexed views revealed no abnormalities. The time to cecum=8 minutes 0 seconds. Withdrawal time=10 minutes 0 seconds.  The scope was withdrawn and the procedure completed. COMPLICATIONS: There were no complications.  ENDOSCOPIC IMPRESSION: 1.   Diminutive sessile polyp was  found in the rectosigmoid colon; polypectomy was performed with a cold snare 2.   Mild diverticulosis was noted in the sigmoid colon 3.   The colon mucosa was otherwise normal  RECOMMENDATIONS: await additional MR images and final reading to be taken tomorrow Advance diet Po iron Tx appropriate  eSigned:  Gatha Mayer, MD, [facility] 06/14/2013 4:23 PM

## 2013-06-14 NOTE — Progress Notes (Signed)
          Daily Rounding Note  06/14/2013, 10:41 AM  LOS: 3 days   SUBJECTIVE:       Pt says stools yellow with no solid or sediment after completing bowel prep.   Stable overnight.   OBJECTIVE:         Vital signs in last 24 hours:    Temp:  [97.5 F (36.4 C)-98.2 F (36.8 C)] 98.2 F (36.8 C) (03/25 0521) Pulse Rate:  [57-72] 57 (03/25 0521) Resp:  [16-18] 16 (03/25 0521) BP: (119-129)/(69-87) 129/69 mmHg (03/25 0521) SpO2:  [97 %-100 %] 97 % (03/25 0521) Last BM Date: 06/14/13  Not reexamined.   Intake/Output from previous day: 03/24 0701 - 03/25 0700 In: 2305.5 [P.O.:580; I.V.:1725.5] Out: 1125 [Urine:1125]  Intake/Output this shift: Total I/O In: 0  Out: 250 [Urine:250]  Lab Results:  Recent Labs  06/12/13 0617 06/13/13 0428  WBC 6.0  --   HGB 6.8* 8.6*  HCT 24.9* 29.7*  PLT 417*  --    BMET  Recent Labs  06/12/13 0617  NA 140  K 4.0  CL 104  CO2 28  GLUCOSE 95  BUN 7  CREATININE 0.83  CALCIUM 8.1*    MRI/MRCP: partially completed yesterday evening but tech informs me that pt will need additional imaging tomorrow to complete the MR study.  ASSESMENT:   * N/V, weight loss since second Nissen. GOO per imaging.  Gastric ulcer on EGD by Dr Marissa Nestle in [place] 05/2010. Clotest +. Completed Flagyl, Biaxin, PPI  Large HH and associated linear ulcers, scar at prepylorus, balloon dilation of esophagus which showed scarring and pseudodiverticular formation on EGD 07/2010 by Dr Laural Golden. Pathology neg for H Pylori.  S/p Nissen fundoplication ~9357/0177, redo nissen 3 months later. Chronic upper abdominal pain since surgery.  EGD 3/22: severe esophagitis, recurrent HH, retained gastric contents.  On IV BID Protonix. 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  * Anemia, iron deficient. Received feraheme on 3/23. PRBC x one on 3/22. Hgb improved.  * Hypodense, smooth, 2 cm lesion of anterior pancreas on  CT scan. MRI/MRCP in process.  * Depression. Tox screen + for cocaine, opiates and THC. No opiates on PTA med list, may have received at [facility] Admits to marijuana only. Wonder about narcotics use given the gastroparesis and if hx of abuse, may explain why surgeon, Dr Davina Poke in [place] refuses to see pt.     PLAN   *  Colonoscopy today.  *  Complete MR studies tomorrow.     Azucena Freed  06/14/2013, 10:41 AM Pager: [phone]

## 2013-06-14 NOTE — Progress Notes (Signed)
Patient notes skin concerns since MRI yesterday. He c/o that the old scars on his arm now appear "whiter" in color since the MRI contrast.

## 2013-06-14 NOTE — Progress Notes (Signed)
Patient ID: Shawn Ayala, male   DOB: 04/14/1965, 48 y.o.   MRN: [id number]  Subjective: MRCP done, not resulted.  Pt continues to c/o abd pain.  Being prepped for colonosocpy.    Objective:  Vital signs:  Filed Vitals:   06/13/13 1020 06/13/13 1352 06/13/13 2106 06/14/13 0521  BP: 124/78 119/73 121/87 129/69  Pulse: 57 58 72 57  Temp:  97.5 F (36.4 C) 97.7 F (36.5 C) 98.2 F (36.8 C)  TempSrc:  Oral Oral Oral  Resp: 16 18 17 16   Height:      Weight:      SpO2: 100% 99% 100% 97%    Last BM Date: 06/12/13  Intake/Output   Yesterday:  03/24 0701 - 03/25 0700 In: 2305.5 [P.O.:580; I.V.:1725.5] Out: 1125 [Urine:1125] This shift:    Physical Exam:  General: Pt awake/alert/oriented x3 in no acute distress  Abdomen: Soft. Nondistended. TTP to epigastric region. No evidence of peritonitis. No incarcerated hernias.   Problem List:   Principal Problem:   Gastroparesis Active Problems:   Hypertension   GERD (gastroesophageal reflux disease)   Anemia, iron deficiency   Hypokalemia   Abdominal pain, chronic, epigastric   Pancreatic mass   Loss of weight   Substance abuse   Acute esophagitis    Results:   Labs: Results for orders placed during the hospital encounter of 06/11/13 (from the past 48 hour(s))  HEMOGLOBIN AND HEMATOCRIT, BLOOD     Status: Abnormal   Collection Time    06/13/13  4:28 AM      Result Value Ref Range   Hemoglobin 8.6 (*) 13.0 - 17.0 g/dL   Comment: REPEATED TO VERIFY     POST TRANSFUSION SPECIMEN   HCT 29.7 (*) 39.0 - 52.0 %    Imaging / Studies: No results found.  Medications / Allergies: per chart  Antibiotics: Anti-infectives   None     Assessment/Plan  Hx HTN, esophageal stricture, hiatal hernia repair x2 last being about 2 years ago in [place]  +THC/cocaine on UDS  PCM  Anemia  Gastroparesis, no GOO on EGD  2cm hypodense pancreatic mass  Await MRCP results Planned for colonoscopy per GI today Will  follow  Erby Pian, [facility] Surgery Pager [phone] Office [phone]  06/14/2013 8:00 AM

## 2013-06-14 NOTE — Progress Notes (Signed)
PROGRESS NOTE  Shawn Ayala OVF:[id number] DOB: [birth year] DOA: 06/11/2013 PCP: Julio Sicks, PA-C  Assessment/Plan:  Principal Problem:  Probable Gastroparesis:  No GOO on EGD. not diabetic. Suspect due to opiates. Patient denies using chronic opiates, but yesterday, wife told me they both "go to different doctors" to get pain medicine. got opiates from somewhere a month ago.  Will decrease dilaudid. Could also be vagus nerve damage from H. Hernia surgery.    -  MRCP done but not resulted -colonoscopy today Per GI     Hypertension   GERD (gastroesophageal reflux disease)   Anemia, iron deficiency: stool heme negative. S/p 2 units, h/h improved. Ferritin 1. Ordered IV iron as well. For colonoscopy   Hypokalemia corrected   Abdominal pain, chronic, epigastric Severe esophagitis:  Change PPI to PO   Pancreatic mass: for MRCP   Loss of weight   Substance abuse: admits to marijuana, not chronic opiates or cocaine- UDS + cocaine   Code Status:  full Family Communication:  Wife 3/22 Disposition Plan:  home  Consultants:  GI  CCS  Procedures:   EGD  Antibiotics:    HPI/Subjective: C/o abd pain still +BM Says he has been up and walking around  Objective: Filed Vitals:   06/14/13 0521  BP: 129/69  Pulse: 57  Temp: 98.2 F (36.8 C)  Resp: 16    Intake/Output Summary (Last 24 hours) at 06/14/13 0922 Last data filed at 06/14/13 0844  Gross per 24 hour  Intake 1945.5 ml  Output   1375 ml  Net  570.5 ml   Filed Weights   06/11/13 0549  Weight: 50.803 kg (112 lb)    Exam:   General:  Comfortable in bed  Cardiovascular: RRR without MGR  Respiratory: CTA without WRR  Abdomen: S, NT, ND when distracted  Ext: no cce  Psych: calm and cooperative  Basic Metabolic Panel:  Recent Labs Lab 06/11/13 0354 06/12/13 0617  NA 143 140  K 3.3* 4.0  CL 102 104  CO2 28 28  GLUCOSE 91 95  BUN 9 7  CREATININE 0.81 0.83  CALCIUM 8.6 8.1*  MG 2.1  --     Liver Function Tests:  Recent Labs Lab 06/11/13 0354  AST 14  ALT 7  ALKPHOS 61  BILITOT 0.3  PROT 6.2  ALBUMIN 3.1*   No results found for this basename: LIPASE, AMYLASE,  in the last 168 hours No results found for this basename: AMMONIA,  in the last 168 hours CBC:  Recent Labs Lab 06/11/13 0354 06/12/13 0617 06/13/13 0428  WBC 6.9 6.0  --   HGB 6.8* 6.8* 8.6*  HCT 24.9* 24.9* 29.7*  MCV 62.4* 64.2*  --   PLT 577* 417*  --    Cardiac Enzymes: No results found for this basename: CKTOTAL, CKMB, CKMBINDEX, TROPONINI,  in the last 168 hours BNP (last 3 results) No results found for this basename: PROBNP,  in the last 8760 hours CBG: No results found for this basename: GLUCAP,  in the last 168 hours  No results found for this or any previous visit (from the past 240 hour(s)).   Studies: No results found.  Scheduled Meds: . cloNIDine  0.1 mg Oral BID  . pantoprazole  40 mg Oral BID   Continuous Infusions: . sodium chloride 75 mL/hr at 06/13/13 2241    Time spent: 25 minutes  Eulogio Bear  [facility] Pager [phone]. If 7PM-7AM, please contact night-coverage at [email or web address], password  [email or web address] 06/14/2013, 9:22 AM  LOS: 3 days

## 2013-06-15 ENCOUNTER — Encounter (HOSPITAL_COMMUNITY): Payer: Self-pay | Admitting: Internal Medicine

## 2013-06-15 DIAGNOSIS — K862 Cyst of pancreas: Secondary | ICD-10-CM

## 2013-06-15 DIAGNOSIS — R109 Unspecified abdominal pain: Secondary | ICD-10-CM

## 2013-06-15 DIAGNOSIS — K863 Pseudocyst of pancreas: Secondary | ICD-10-CM

## 2013-06-15 LAB — CBC
HCT: 31.2 % — ABNORMAL LOW (ref 39.0–52.0)
Hemoglobin: 9.1 g/dL — ABNORMAL LOW (ref 13.0–17.0)
MCH: 19.7 pg — ABNORMAL LOW (ref 26.0–34.0)
MCHC: 29.2 g/dL — AB (ref 30.0–36.0)
MCV: 67.4 fL — ABNORMAL LOW (ref 78.0–100.0)
PLATELETS: 503 10*3/uL — AB (ref 150–400)
RBC: 4.63 MIL/uL (ref 4.22–5.81)
RDW: 22.7 % — AB (ref 11.5–15.5)
WBC: 10.6 10*3/uL — AB (ref 4.0–10.5)

## 2013-06-15 LAB — BASIC METABOLIC PANEL
BUN: 3 mg/dL — ABNORMAL LOW (ref 6–23)
CHLORIDE: 105 meq/L (ref 96–112)
CO2: 29 mEq/L (ref 19–32)
Calcium: 8.7 mg/dL (ref 8.4–10.5)
Creatinine, Ser: 0.92 mg/dL (ref 0.50–1.35)
GFR calc non Af Amer: >90 mL/min (ref >90)
Glucose, Bld: 93 mg/dL (ref 70–99)
POTASSIUM: 3.9 meq/L (ref 3.7–5.3)
Sodium: 145 mEq/L (ref 137–147)

## 2013-06-15 MED ORDER — HYDROMORPHONE HCL PF 1 MG/ML IJ SOLN
0.2500 mg | INTRAMUSCULAR | Status: DC | PRN
  Administered 2013-06-15 – 2013-06-16 (×5): 0.25 mg via INTRAVENOUS
  Filled 2013-06-15 (×5): qty 1

## 2013-06-15 NOTE — Progress Notes (Addendum)
Daily Rounding Note  06/15/2013, 9:41 AM  LOS: 4 days   SUBJECTIVE:       "I feel like s**t".  [place] this AM Dr Eliseo Squires had advised pt she would not accelerate narcotics dosing and he would get no narcotics at discharge unless he is determined to have cancer. Pt having ongoing, mostly upper, abdominal pain.  Tolerating solids. Had BMs yesterday.  OBJECTIVE:         Vital signs in last 24 hours:    Temp:  [97.5 F (36.4 C)-98.4 F (36.9 C)] 97.5 F (36.4 C) (03/26 0609) Pulse Rate:  [46-76] 61 (03/26 0609) Resp:  [8-22] 16 (03/26 0609) BP: (115-160)/(60-95) 127/78 mmHg (03/26 0609) SpO2:  [97 %-100 %] 97 % (03/26 0609) Weight:  [58.378 kg (128 lb 11.2 oz)] 58.378 kg (128 lb 11.2 oz) (03/26 0609) Last BM Date: 06/14/13 General: looks old for age.   Heart: RRR Chest: clear bil.  No dyspnea or cough Abdomen: soft, BS present, tender to even light touch.  Not distended  Extremities: no CCE Neuro/Psych:  Oriented x 3.    Lab Results:  Recent Labs  06/13/13 0428 06/15/13 0547  WBC  --  10.6*  HGB 8.6* 9.1*  HCT 29.7* 31.2*  PLT  --  503*   BMET  Recent Labs  06/15/13 0547  NA 145  K 3.9  CL 105  CO2 29  GLUCOSE 93  BUN 3*  CREATININE 0.92  CALCIUM 8.7    Studies/Results: Awaiting completion of MRI  ASSESMENT:   * N/V, weight loss since second Nissen. GOO per imaging.  Gastric ulcer on EGD by Dr Marissa Nestle in [place] 05/2010. Clotest +. Completed Flagyl, Biaxin, PPI.  Large HH and associated linear ulcers, scar at prepylorus, balloon dilation of esophagus which showed scarring and pseudodiverticular formation on EGD 07/2010 by Dr Laural Golden. Pathology neg for H Pylori.  S/p Nissen fundoplication ~6962/9528, redo nissen 3 months later. Chronic upper abdominal pain since surgery. Per Mr Fitzhenry the surgeon, Dr Davina Poke in [place], refuses to see him.  EGD 3/22: severe esophagitis, recurrent HH, retained  gastric contents.  On po BID Protonix. 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  * Anemia, iron deficient. Received feraheme on 3/23. PRBC x one on 3/22. Hgb improved.  Colonoscopy 3/25 with small rectosigmoid polyp, sigmoid tics.  Pathology pending.  * Hypodense, smooth, 2 cm lesion of anterior pancreas on CT scan. MRI/MRCP to be completed today.  * Depression. Tox screen + for cocaine, opiates and THC. No opiates on PTA med list, may have received at [facility]. Admits to marijuana only. Wonder about narcotics use given the gastroparesis and if hx of abuse, may explain surgeon's dismissal of pt.    PLAN   *  Await MRI/MRCP completion.  No plans for any further workup.    Addendum 1412 IMPRESSION:  1. Moderate degradation, secondary to apparent pre study gadolinium  (or other T1 shortening substance) on board. This was present  despite attempting the study on 2 different days. This is of  indeterminate etiology.  2. Likely cystic lesion arising from the pancreatic head ventrally.  Favor a pseudocyst, possibly minimally complicated. Cystic neoplasm  felt less likely. Given the limitations of the current exam,  follow-up with pre and post contrast CT at 6 months, using a  pancreatic protocol technique, is recommended.  3. Similar to minimal improvement in gastric distention. This  remains suspicious for gastric outlet  obstruction.  4. Moderate hiatal hernia with presumably postoperative seroma  adjacent to the herniated stomach.  5. Trace left pleural fluid.   Azucena Freed  06/15/2013, 9:41 AM Pager: [phone]  [facility] Attending  I have also seen and assessed the patient and agree with the above note. He has vomited solid food but tolerated liquids and was on liquids at home.  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  He gets regular care in [place]  ([place] and should f/u there I suppose - re: cystic lesion of  pancreas.  Perhaps he could be seen at [facility].   I am putting him back on liquids - he is asking to go home - says he was on liquids there. I think that is sensible and probably the best he can do.  If he decided to f/u [facility] he should call for an appointment with Dr. Fuller Plan - who did original consult.  Call if other ?'s    Gatha Mayer, MD, [facility] [phone] (pager) 06/15/2013 4:55 PM

## 2013-06-15 NOTE — Progress Notes (Signed)
Patient ID: Shawn Ayala, male   DOB: [birth year], 48 y.o.   MRN: [id number]  Subjective: No changes in pain.  Tolerated diet yesterday. H&h stable.    Objective:  Vital signs:  Filed Vitals:   06/14/13 1625 06/14/13 1635 06/14/13 2102 06/15/13 0609  BP:  123/60 120/79 127/78  Pulse: 59 76 58 61  Temp:  98.4 F (36.9 C) 97.7 F (36.5 C) 97.5 F (36.4 C)  TempSrc:  Oral Oral Oral  Resp: '14 16 16 16  ' Height:    '5\' 3"'  (1.6 m)  Weight:    128 lb 11.2 oz (58.378 kg)  SpO2: 99% 100% 98% 97%    Last BM Date: 06/14/13  Intake/Output   Yesterday:  03/25 0701 - 03/26 0700 In: 2021.5 [P.O.:240; I.V.:1781.5] Out: 1350 [Urine:1350] This shift:    I/O last 3 completed shifts: In: 3169.5 [P.O.:460; I.V.:2709.5] Out: 2075 [Urine:2075]     Physical Exam:  General: Pt awake/alert/oriented x3 in no acute distress  Abdomen: Soft. Nondistended. TTP to epigastric region. No evidence of peritonitis. No incarcerated hernias.  Problem List:   Principal Problem:   Gastroparesis Active Problems:   Hypertension   GERD (gastroesophageal reflux disease)   Anemia, iron deficiency   Hypokalemia   Abdominal pain, chronic, epigastric   Pancreatic mass   Loss of weight   Substance abuse   Acute esophagitis   Benign neoplasm of colon   Diverticulosis of colon (without mention of hemorrhage)    Results:   Labs: Results for orders placed during the hospital encounter of 06/11/13 (from the past 48 hour(s))  CBC     Status: Abnormal   Collection Time    06/15/13  5:47 AM      Result Value Ref Range   WBC 10.6 (*) 4.0 - 10.5 K/uL   RBC 4.63  4.22 - 5.81 MIL/uL   Hemoglobin 9.1 (*) 13.0 - 17.0 g/dL   HCT 31.2 (*) 39.0 - 52.0 %   MCV 67.4 (*) 78.0 - 100.0 fL   MCH 19.7 (*) 26.0 - 34.0 pg   MCHC 29.2 (*) 30.0 - 36.0 g/dL   RDW 22.7 (*) 11.5 - 15.5 %   Platelets 503 (*) 150 - 400 K/uL  BASIC METABOLIC PANEL     Status: Abnormal   Collection Time    06/15/13  5:47 AM      Result Value  Ref Range   Sodium 145  137 - 147 mEq/L   Potassium 3.9  3.7 - 5.3 mEq/L   Chloride 105  96 - 112 mEq/L   CO2 29  19 - 32 mEq/L   Glucose, Bld 93  70 - 99 mg/dL   BUN 3 (*) 6 - 23 mg/dL   Creatinine, Ser 0.92  0.50 - 1.35 mg/dL   Calcium 8.7  8.4 - 10.5 mg/dL   GFR calc non Af Amer >90  >90 mL/min   GFR calc Af Amer >90  >90 mL/min   Comment: (NOTE)     The eGFR has been calculated using the CKD EPI equation.     This calculation has not been validated in all clinical situations.     eGFR's persistently <90 mL/min signify possible Chronic Kidney     Disease.    Imaging / Studies: No results found.  Medications / Allergies: per chart  Antibiotics: Anti-infectives   None      Assessment/Plan  Hx HTN, esophageal stricture, hiatal hernia repair x2 last being about 2 years ago in  [place]  +THC/cocaine on UDS  PCM  Anemia  Gastroparesis, no GOO on EGD S/p colonoscopy   2cm hypodense pancreatic mass  Await MRCP results, additional images to be done today Advance diet as tolerated Will follow  Erby Pian, [facility] Pager [phone] Office [phone]  06/15/2013 7:55 AM

## 2013-06-15 NOTE — Progress Notes (Signed)
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 Patient examined and I agree with the assessment and plan  Georganna Skeans, MD, MPH, FACS Trauma: [phone] General Surgery: [phone]  06/15/2013 2:30 PM

## 2013-06-15 NOTE — Progress Notes (Addendum)
PROGRESS NOTE  Shawn Ayala HAL:[id number] DOB: 10-Dec-1965 DOA: 06/11/2013 PCP: Julio Sicks, PA-C  Assessment/Plan:  Principal Problem:  Probable Gastroparesis:  No GOO on EGD. not diabetic. Suspect due to opiates. Patient denies using chronic opiates, but wife stated they both "go to different doctors" to get pain medicine. got opiates from somewhere a month ago.  Will decrease dilaudid. Could also be vagus nerve damage from H. Hernia surgery.    -was not able to tolerate reglan -  MRCP done but not resulted as they need more images- finally resulted on 3/26pre and post contrast CT at 6 months, using a  pancreatic protocol technique -colonoscopy by GI 3/25     Hypertension   GERD (gastroesophageal reflux disease)   Anemia, iron deficiency: stool heme negative. S/p 2 units, h/h improved. Ferritin 1. Ordered IV iron as well. s/p colonoscopy   Hypokalemia corrected   Abdominal pain, chronic, epigastric- will not be d/c'd with narcotics unless mass on pancreas is cancer Severe esophagitis:  Change PPI to PO   Pancreatic mass: for MRCP- await further imaging: pre and post contrast CT at 6 months, using a  pancreatic protocol technique    Loss of weight   Substance abuse: admits to marijuana, not chronic opiates or cocaine- UDS + cocaine   Code Status:  full Family Communication:  Wife 3/22 Disposition Plan:  home  Consultants:  GI  CCS  Procedures:   EGD  Colonoscopy  MRCP  Antibiotics:    HPI/Subjective: C/o abd pain still +BM Says he has been up and walking around  Objective: Filed Vitals:   06/15/13 0609  BP: 127/78  Pulse: 61  Temp: 97.5 F (36.4 C)  Resp: 16    Intake/Output Summary (Last 24 hours) at 06/15/13 0934 Last data filed at 06/15/13 0600  Gross per 24 hour  Intake 2021.5 ml  Output   1100 ml  Net  921.5 ml   Filed Weights   06/11/13 0549 06/15/13 0609  Weight: 50.803 kg (112 lb) 58.378 kg (128 lb 11.2 oz)     Exam:   General:  Comfortable in bed  Cardiovascular: RRR without MGR  Respiratory: CTA without WRR  Abdomen: S, NT, ND when distracted  Ext: no cce  Psych: calm and cooperative  Basic Metabolic Panel:  Recent Labs Lab 06/11/13 0354 06/12/13 0617 06/15/13 0547  NA 143 140 145  K 3.3* 4.0 3.9  CL 102 104 105  CO2 28 28 29   GLUCOSE 91 95 93  BUN 9 7 3*  CREATININE 0.81 0.83 0.92  CALCIUM 8.6 8.1* 8.7  MG 2.1  --   --    Liver Function Tests:  Recent Labs Lab 06/11/13 0354  AST 14  ALT 7  ALKPHOS 61  BILITOT 0.3  PROT 6.2  ALBUMIN 3.1*   No results found for this basename: LIPASE, AMYLASE,  in the last 168 hours No results found for this basename: AMMONIA,  in the last 168 hours CBC:  Recent Labs Lab 06/11/13 0354 06/12/13 0617 06/13/13 0428 06/15/13 0547  WBC 6.9 6.0  --  10.6*  HGB 6.8* 6.8* 8.6* 9.1*  HCT 24.9* 24.9* 29.7* 31.2*  MCV 62.4* 64.2*  --  67.4*  PLT 577* 417*  --  503*   Cardiac Enzymes: No results found for this basename: CKTOTAL, CKMB, CKMBINDEX, TROPONINI,  in the last 168 hours BNP (last 3 results) No results found for this basename: PROBNP,  in the last 8760 hours  CBG: No results found for this basename: GLUCAP,  in the last 168 hours  No results found for this or any previous visit (from the past 240 hour(s)).   Studies: No results found.  Scheduled Meds: . cloNIDine  0.1 mg Oral BID  . pantoprazole  40 mg Oral BID   Continuous Infusions:    Time spent: 25 minutes  VANN, JESSICA  [facility] Pager [phone]. If 7PM-7AM, please contact night-coverage at [email or web address], password [email or web address] 06/15/2013, 9:34 AM  LOS: 4 days

## 2013-06-16 MED ORDER — PANTOPRAZOLE SODIUM 40 MG PO TBEC: 40.0000 mg | DELAYED_RELEASE_TABLET | Freq: Two times a day (BID) | ORAL | Status: DC

## 2013-06-16 MED ORDER — ONDANSETRON HCL 4 MG PO TABS: 4.0000 mg | ORAL_TABLET | Freq: Four times a day (QID) | ORAL | Status: DC | PRN

## 2013-06-16 NOTE — Discharge Summary (Signed)
Physician Discharge Summary  Shawn Ayala UXN:[id number] DOB: [birth year] DOA: 06/11/2013  PCP: Julio Sicks, PA-C  Admit date: 06/11/2013 Discharge date: 06/16/2013  Time spent: 35 minutes  Recommendations for Outpatient Follow-up:  1. Outpatient referral to pain clinic 2. Outpatient referral to GI clinic at [facility] 3. 6 month- pre and post contrast CT using pancreatic protocol  Discharge Diagnoses:  Principal Problem:   Gastroparesis Active Problems:   Hypertension   GERD (gastroesophageal reflux disease)   Anemia, iron deficiency   Hypokalemia   Abdominal pain, chronic, epigastric   Pancreatic cystic lesion   Loss of weight   Substance abuse   Acute esophagitis   Benign neoplasm of colon   Diverticulosis of colon (without mention of hemorrhage)   Discharge Condition: improved  Diet recommendation: as tolerated  Filed Weights   06/11/13 0549 06/15/13 0609  Weight: 50.803 kg (112 lb) 58.378 kg (128 lb 11.2 oz)    History of present illness:  Shawn Ayala is a 48 y.o. male with history of hiatal hernia that was repaired approximately a year and a half ago in [place]. Patient reports that since that surgery, things have "not been right". He has had chronic/intermittent epigastric abdominal pain with vomiting. He has had an unintentional weight loss of approximately 70lbs in the last year and a half. He reports that no matter what he eats, solids or liquids, he ends up throwing it up. He also reports noticing "kind of dark" stools for the last several weeks. He has not noticed any hematochezia. Occasionally his vomitus contains blood. He is feeling increasingly fatigued and weak. He is frustrated in the persistence of his symptoms. He was evaluated at [facility] where CT abdomen indicated a gastric outlet obstruction and possible pancreatic mass. Hgb was checked and found to be low at 7.4 with MCV of 58. Baseline hemoglobin runs closer to 12-13. Case was discussed with  general surgery at [facility], but due to lack of GI coverage there, recommendations were to transfer to [facility]. Case was discussed by EDP with Dr. Fuller Plan with GI who agreed to see the patient in consultation.   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  On po BID Protonix. 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   Anemia, iron deficient. Received feraheme on 3/23. PRBC x one on 3/22. Hgb improved.  Colonoscopy 3/25 with small rectosigmoid polyp, sigmoid tics. Pathology pending- GI follow up  Hypodense, smooth, 2 cm lesion of anterior pancreas on CT scan.   Depression. Tox screen + for cocaine, opiates and THC. No opiates on PTA med list, may have received at [facility]. Admits to marijuana only. Wonder about narcotics use given the gastroparesis and if hx of abuse, may explain surgeon's dismissal of pt.   Procedures:  EGD  Colonoscopy  MRCP  Consultations:  GI  Discharge Exam: Filed Vitals:   06/16/13 0624  BP: 136/79  Pulse: 47  Temp: 97.6 F (36.4 C)  Resp: 18    General: A+Ox3, NAD Cardiovascular: rrr Respiratory: clear anterior  Discharge Instructions  Discharge Orders   Future Orders Complete By Expires   Discharge instructions  As directed    Comments:     Diet as tolerated Needs referral to pain management Need referral to GI clinic at [facility]   Increase activity slowly  As directed        Medication List    STOP taking these medications       esomeprazole 40 MG capsule  Commonly known  as:  [place] these medications       acetaminophen 325 MG tablet  Commonly known as:  TYLENOL  Take 650 mg by mouth every 6 (six) hours as needed for mild pain, fever or headache.     cloNIDine 0.1 MG tablet  Commonly known as:  CATAPRES  Take 0.1 mg by mouth 2 (two) times daily.     ondansetron 4 MG tablet  Commonly known as:  ZOFRAN  Take 1 tablet (4 mg total) by mouth every 6 (six) hours as needed for nausea.     pantoprazole 40 MG tablet  Commonly known as:  PROTONIX  Take 1 tablet (40 mg total) by mouth 2 (two) times daily.       Allergies  Allergen Reactions  . Reglan [Metoclopramide] Other (See Comments)  . Darvocet [Propoxyphene N-Acetaminophen]   . Nsaids     Ulcers  . Tramadol Nausea And Vomiting      The results of significant diagnostics from this hospitalization (including imaging, microbiology, ancillary and laboratory) are listed below for reference.    Significant Diagnostic Studies: Mr Jeananne Rama W/wo Cm/mrcp  06/15/2013   CLINICAL DATA:  Gastroparesis. Hypertension. Gastroesophageal reflux disease. Substance abuse. Pancreatic cystic lesion on CT.  EXAM: MRI ABDOMEN WITHOUT AND WITH CONTRAST (INCLUDING MRCP)  TECHNIQUE: Multiplanar multisequence MR imaging of the abdomen was performed both before and after the administration of intravenous contrast. Heavily T2-weighted images of the biliary and pancreatic ducts were obtained, and three-dimensional MRCP images were rendered by post processing.  CONTRAST:  110mL MULTIHANCE GADOBENATE DIMEGLUMINE 529 MG/ML IV SOLN  COMPARISON:  CT ABD-PELV W/ CM dated 06/10/2013; CT ABD-PELV W/O CM dated 01/30/2011  FINDINGS: Initial exam had appearance of gadolinium contrast on board prior to beginning the study. Therefore, the patient was returned approximately 36 hours subsequent. The appearance again suggests gadolinium on board. This is of indeterminate etiology. This degrades both the T1 and T2 weighted images. Enhancement  cannot be evaluated and the precontrast T1/T2 signal characteristics are distorted.  Normal heart size. Trace left pleural fluid. A moderate hiatal hernia. Fluid collection adjacent the herniated stomach measures 2.5 x 2.8 cm on image 3/series 5.  The liver and spleen are both T2 hypointense, presumably secondary to contrast T2 shortening . No focal liver lesion. Normal spleen.  The stomach remains distended. Similar to minimally improved since 06/10/2013. The duodenum is normal in caliber.  Normal gallbladder. The intrahepatic ducts are upper normal. The common duct is diminutive and not well visualized in the region of the inferior portion of the porta hepatis. Example image 10/ series 602. This is favored to be due to mass effect from the adjacent gastric antrum. There is no proximal common duct dilatation to suggest significant obstruction. The common duct measures 6 mm just above this level.  No pancreatic ductal dilatation.  Corresponding to the CT abnormality, likely arising from the ventral aspect of the pancreatic head, is a 2.5 x 3.0 cm lesion which is likely cystic.  Normal adrenal glands.  Bilateral renal cysts. No abdominal adenopathy or ascites.  IMPRESSION: 1. Moderate degradation, secondary to apparent pre study gadolinium (or other T1 shortening substance) on board. This was present despite attempting the study on 2 different days. This is of indeterminate etiology. 2. Likely cystic lesion arising from the pancreatic head ventrally. Favor a pseudocyst, possibly minimally complicated. Cystic neoplasm felt less likely. Given the limitations of the current exam, follow-up with pre and post contrast CT at 6 months, using a pancreatic protocol technique, is recommended. 3. Similar to minimal improvement in gastric distention. This remains suspicious for gastric outlet obstruction. 4. Moderate hiatal hernia with presumably postoperative seroma adjacent to the herniated stomach. 5. Trace left pleural fluid.    Electronically Signed   By: Abigail Miyamoto M.D.   On: 06/15/2013 11:57    Microbiology: No results found for this or any previous visit (from the past 240 hour(s)).   Labs: Basic Metabolic Panel:  Recent Labs Lab 06/11/13 0354 06/12/13 0617 06/15/13 0547  NA 143 140 145  K 3.3* 4.0 3.9  CL 102 104 105  CO2 28 28 29   GLUCOSE 91 95 93  BUN 9 7 3*  CREATININE 0.81 0.83 0.92  CALCIUM 8.6 8.1* 8.7  MG 2.1  --   --    Liver Function Tests:  Recent Labs Lab 06/11/13 0354  AST 14  ALT 7  ALKPHOS 61  BILITOT 0.3  PROT 6.2  ALBUMIN 3.1*   No results found for this basename: LIPASE, AMYLASE,  in the last 168 hours No results found for this basename: AMMONIA,  in the last 168 hours CBC:  Recent Labs Lab 06/11/13 0354 06/12/13 0617 06/13/13 0428 06/15/13 0547  WBC 6.9 6.0  --  10.6*  HGB 6.8* 6.8* 8.6* 9.1*  HCT 24.9* 24.9* 29.7* 31.2*  MCV 62.4* 64.2*  --  67.4*  PLT 577* 417*  --  503*   Cardiac Enzymes: No results found for this basename: CKTOTAL, CKMB, CKMBINDEX, TROPONINI,  in the last 168 hours BNP: BNP (last 3 results) No results found for this basename: PROBNP,  in the last 8760 hours CBG: No results found for this basename: GLUCAP,  in the last 168 hours     Signed:  Eulogio Bear  [facility] 06/16/2013, 8:51 AM

## 2013-06-18 ENCOUNTER — Encounter: Payer: Self-pay | Admitting: Internal Medicine

## 2013-06-18 NOTE — Progress Notes (Signed)
Quick Note:  Diminutive adenoma ______ 

## 2014-04-25 ENCOUNTER — Emergency Department (HOSPITAL_COMMUNITY)
Admission: EM | Admit: 2014-04-25 | Discharge: 2014-04-26 | Disposition: A | Discharge status: Home / Self Care | Payer: Medicare Other | Attending: Emergency Medicine | Admitting: Emergency Medicine

## 2014-04-25 ENCOUNTER — Encounter (HOSPITAL_COMMUNITY): Payer: Self-pay | Admitting: *Deleted

## 2014-04-25 DIAGNOSIS — Z79899 Other long term (current) drug therapy: Secondary | ICD-10-CM | POA: Diagnosis not present

## 2014-04-25 DIAGNOSIS — K219 Gastro-esophageal reflux disease without esophagitis: Secondary | ICD-10-CM | POA: Insufficient documentation

## 2014-04-25 DIAGNOSIS — R011 Cardiac murmur, unspecified: Secondary | ICD-10-CM | POA: Diagnosis not present

## 2014-04-25 DIAGNOSIS — R634 Abnormal weight loss: Secondary | ICD-10-CM | POA: Insufficient documentation

## 2014-04-25 DIAGNOSIS — E860 Dehydration: Secondary | ICD-10-CM | POA: Insufficient documentation

## 2014-04-25 DIAGNOSIS — R112 Nausea with vomiting, unspecified: Secondary | ICD-10-CM | POA: Insufficient documentation

## 2014-04-25 DIAGNOSIS — Z9889 Other specified postprocedural states: Secondary | ICD-10-CM | POA: Diagnosis not present

## 2014-04-25 DIAGNOSIS — I10 Essential (primary) hypertension: Secondary | ICD-10-CM | POA: Diagnosis not present

## 2014-04-25 DIAGNOSIS — R1012 Left upper quadrant pain: Secondary | ICD-10-CM | POA: Diagnosis not present

## 2014-04-25 DIAGNOSIS — R5383 Other fatigue: Secondary | ICD-10-CM | POA: Insufficient documentation

## 2014-04-25 DIAGNOSIS — R531 Weakness: Secondary | ICD-10-CM | POA: Diagnosis not present

## 2014-04-25 HISTORY — DX: Diverticulitis of intestine, part unspecified, without perforation or abscess without bleeding: K57.92

## 2014-04-25 LAB — CBC WITH DIFFERENTIAL/PLATELET
Basophils Absolute: 0 10*3/uL (ref 0.0–0.1)
Basophils Relative: 0 % (ref 0–1)
EOS PCT: 3 % (ref 0–5)
Eosinophils Absolute: 0.3 10*3/uL (ref 0.0–0.7)
HEMATOCRIT: 42.2 % (ref 39.0–52.0)
HEMOGLOBIN: 13.8 g/dL (ref 13.0–17.0)
LYMPHS ABS: 1.6 10*3/uL (ref 0.7–4.0)
Lymphocytes Relative: 19 % (ref 12–46)
MCH: 28.3 pg (ref 26.0–34.0)
MCHC: 32.7 g/dL (ref 30.0–36.0)
MCV: 86.5 fL (ref 78.0–100.0)
Monocytes Absolute: 0.6 10*3/uL (ref 0.1–1.0)
Monocytes Relative: 7 % (ref 3–12)
Neutro Abs: 5.9 10*3/uL (ref 1.7–7.7)
Neutrophils Relative %: 71 % (ref 43–77)
Platelets: 394 10*3/uL (ref 150–400)
RBC: 4.88 MIL/uL (ref 4.22–5.81)
RDW: 13.7 % (ref 11.5–15.5)
WBC: 8.3 10*3/uL (ref 4.0–10.5)

## 2014-04-25 MED ORDER — SODIUM CHLORIDE 0.9 % IV BOLUS (SEPSIS)
1000.0000 mL | Freq: Once | INTRAVENOUS | Status: AC
  Administered 2014-04-25: 1000 mL via INTRAVENOUS

## 2014-04-25 MED ORDER — HYDROMORPHONE HCL 1 MG/ML IJ SOLN
1.0000 mg | Freq: Once | INTRAMUSCULAR | Status: AC
  Administered 2014-04-25: 1 mg via INTRAVENOUS
  Filled 2014-04-25: qty 1

## 2014-04-25 MED ORDER — GI COCKTAIL ~~LOC~~
30.0000 mL | Freq: Once | ORAL | Status: AC
  Administered 2014-04-26: 30 mL via ORAL
  Filled 2014-04-25: qty 30

## 2014-04-25 MED ORDER — ONDANSETRON HCL 4 MG/2ML IJ SOLN
4.0000 mg | Freq: Once | INTRAMUSCULAR | Status: AC
Start: 2014-04-25 — End: 2014-04-25
  Administered 2014-04-25: 4 mg via INTRAVENOUS
  Filled 2014-04-25: qty 2

## 2014-04-25 NOTE — ED Notes (Signed)
Pt reports that he had hernia surgery 2 years ago and believes "it's come undone".  Pt reporting generalized abdominal pain, nausea, and vomiting blood.  Pt reporting no emesis in past 3 hours.

## 2014-04-25 NOTE — ED Provider Notes (Signed)
CSN: [id number]     Arrival date & time 04/25/14  2100 History  This chart was scribed for Sharyon Cable, MD by Edison Simon, ED Scribe. This patient was seen in room APA11/APA11 and the patient's care was started at 11:27 PM.   Chief Complaint  Patient presents with  . Hematemesis   Patient is a 49 y.o. male presenting with abdominal pain. The history is provided by the patient and a relative. No language interpreter was used.  Abdominal Pain Pain location:  LUQ Pain severity:  Severe Onset quality:  Gradual Duration: 1 year. Timing:  Intermittent Progression:  Worsening Chronicity:  Chronic Relieved by:  Nothing Worsened by:  Palpation, movement and eating Associated symptoms: fatigue, nausea and vomiting   Associated symptoms: no fever and no shortness of breath     HPI Comments: Shawn Ayala is a 49 y.o. male who presents to the Emergency Department complaining of intermittent abdominal pain with associated nausea and vomiting with onset 1 year ago; he states that a hiatal hernia repair has "come undone." He states he had the procedure in [place] a few years ago; he notes a prior hiatal hernia repair that also came undone. He states he has had these symptoms intermittently for the past year and states episodes occur 1-2x a month, and he states symptoms are worsening. He states he is unable to tolerate fluids or solids and notes blood in his emesis. He has difficulty estimating the amount of blood and states some of it was black. He states he feels generally weak. He reports associated abdominal pain with breathing due to hernia, He states he was evaluated for the same 1 week ago at ED in [place] and had CT scan there. Family notes he was hospitalized at [facility] in December for an obstruction. He was supposed to follow up with GI in 3-4 weeks but was not able to make an appointment; he is scheduled for visit in June. He denies fever, headache above baseline  Past Medical History   Diagnosis Date  . GERD (gastroesophageal reflux disease)   . Hypertension   . Heart murmur   . Hiatal hernia   . Migraines   . 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 Diverticulitis    Past Surgical History  Procedure Laterality Date  . Left inguinal hernia repair    . Esophagogastroduodenoscopy N/A 06/11/2013    Procedure: ESOPHAGOGASTRODUODENOSCOPY (EGD);  Surgeon: Ladene Artist, MD;  Location: [facility] ENDOSCOPY;  Service: Endoscopy;  Laterality: N/A;  . Colonoscopy N/A 06/14/2013    Procedure: COLONOSCOPY;  Surgeon: Gatha Mayer, MD;  Location: [place];  Service: Endoscopy;  Laterality: N/A;   History reviewed. No pertinent family history. History  Substance Use Topics  . Smoking status: Never Smoker   . Smokeless tobacco: Current User    Types: Chew     Comment: Uses snuff since age 67yr  . Alcohol Use: No    Review of Systems  Constitutional: Positive for fatigue and unexpected weight change. Negative for fever.  Respiratory: Negative for shortness of breath.   Gastrointestinal: Positive for nausea, vomiting and abdominal pain.  Genitourinary: Positive for difficulty urinating.  Neurological: Positive for weakness. Negative for headaches.       Generalized weakness   All other systems reviewed and are negative.     Allergies  Reglan; Ativan; Darvocet; Fentanyl; Nsaids; Tramadol; and Vistaril  Home Medications   Prior to Admission medications   Medication Sig Start Date End Date Taking? Authorizing Provider  sucralfate (CARAFATE) 1 G tablet Take 1 g by mouth 4 (four) times daily -  with meals and at bedtime.   Yes Historical Provider, MD  acetaminophen (TYLENOL) 325 MG tablet Take 650 mg by mouth every 6 (six) hours as needed for mild pain, fever or headache.    Historical Provider, MD  cloNIDine (CATAPRES) 0.1 MG tablet Take 0.1 mg by mouth 2 (two) times daily.      Historical Provider, MD  ondansetron (ZOFRAN) 4 MG tablet Take 1 tablet (4 mg total) by mouth  every 6 (six) hours as needed for nausea. 06/16/13   Geradine Girt, DO  pantoprazole (PROTONIX) 40 MG tablet Take 1 tablet (40 mg total) by mouth 2 (two) times daily. 06/16/13   Jessica U Vann, DO   BP 158/97 mmHg  Pulse 69  Temp(Src) 97.5 F (36.4 C) (Oral)  Resp 18  Ht 5\' 2"  (1.575 m)  Wt 115 lb (52.164 kg)  BMI 21.03 kg/m2  SpO2 100% Physical Exam  Nursing note and vitals reviewed.   CONSTITUTIONAL: Well developed/well nourished HEAD: Normocephalic/atraumatic EYES: EOMI/PERRL, no icterus ENMT: Mucous membranes moist NECK: supple no meningeal signs SPINE/BACK:entire spine nontender CV: S1/S2 noted, no murmurs/rubs/gallops noted LUNGS: Lungs are clear to auscultation bilaterally, no apparent distress ABDOMEN: soft, mild LUQ tenderness, no rebound or guarding, bowel sounds noted throughout abdomen.  Surgical scars noted Rectal: stool color brown, no melena, no blood, chaperone present GU:no cva tenderness NEURO: Pt is awake/alert/appropriate, moves all extremitiesx4.  No facial droop.   EXTREMITIES: pulses normal/equal, full ROM SKIN: warm, color normal PSYCH: no abnormalities of mood noted, alert and oriented to situation  ED Course  Procedures   DIAGNOSTIC STUDIES: Oxygen Saturation is 10% on room air, normal by my interpretation.    COORDINATION OF CARE: 11:37 PM Discussed treatment plan with patient at beside, the patient agrees with the plan and has no further questions at this time.  12:51 AM Records from [facility] hospital: CT abd/pelvis from 04/19/14 - moderate hiatal hernia noted.  Stomach distention noted (stable finding) Per records, pt had laparoscopic paraesophageal hernia procedure.  He has had pain since with recurrent severe dysphagia.   Per EPIC records, pt admitted in [facility] system in March 2015, had EGD done at that time 1:06 AM Narcotic database reviewed in both [place] and [place] - recent narcotics given to patient  At time of discharge, I spoke at length  with patient concerning labs and need for followup.  At this point, given h/o intermittent pain for one year, I doubt he has acute abdominal/vascular emergency . He is tolerating PO.  His labs are reassuring.  I feel he is safe/appropriate for d/c especially since he was seen in [place] end of January.  I advised him that outpatient narcotics are generally not recommend for chronic abdominal pain.  Pt became very upset, he began to curse at me and told me he was going to [place] - Abnormal; Notable for the following:    Glucose, Bld 154 (*)    All other components within normal limits  URINALYSIS, ROUTINE W REFLEX MICROSCOPIC - Abnormal; Notable for the following:    Color, Urine AMBER (*)    Glucose, UA 100 (*)    Ketones, ur TRACE (*)    All other components within normal limits  CBC WITH DIFFERENTIAL/PLATELET  LIPASE, BLOOD  TROPONIN I  POC OCCULT BLOOD, ED    EKG Interpretation  Date/Time:  Thursday April 26 2014 00:07:06 EST Ventricular Rate:  63 PR Interval:  151 QRS Duration: 96 QT Interval:  343 QTC Calculation: 351 R Axis:   85 Text Interpretation:  Sinus rhythm Consider left atrial enlargement Nonspecific T abnormalities, diffuse leads No previous ECGs available Confirmed by Christy Gentles  MD, Anjelica Gorniak ([id number]) on 04/26/2014 12:13:58 AM   Medications  ondansetron (ZOFRAN) injection 4 mg (4 mg Intravenous Given 04/25/14 2351)  HYDROmorphone (DILAUDID) injection 1 mg (1 mg Intravenous Given 04/25/14 2351)  sodium chloride 0.9 % bolus 1,000 mL (0 mLs Intravenous Stopped 04/26/14 0212)  gi cocktail (Maalox,Lidocaine,Donnatal) (30 mLs Oral Given 04/26/14 0002)  HYDROmorphone (DILAUDID) injection 1 mg (1 mg Intravenous Given 04/26/14 0102)  pantoprazole (PROTONIX) injection 40 mg (40 mg Intravenous Given 04/26/14 0102)  ondansetron (ZOFRAN) injection 4 mg (4 mg Intravenous Given 04/26/14 0201)     MDM   Final  diagnoses:  Non-intractable vomiting with nausea, vomiting of unspecified type  Left upper quadrant pain  Dehydration    Nursing notes including past medical history and social history reviewed and considered in documentation xrays/imaging reviewed by myself and considered during evaluation Labs/vital reviewed myself and considered during evaluation Previous records reviewed and considered    I personally performed the services described in this documentation, which was scribed in my presence. The recorded information has been reviewed and is accurate.      Sharyon Cable, MD 04/26/14 [phone]

## 2014-04-26 LAB — URINALYSIS, ROUTINE W REFLEX MICROSCOPIC
BILIRUBIN URINE: NEGATIVE
Glucose, UA: 100 mg/dL — AB
Hgb urine dipstick: NEGATIVE
Leukocytes, UA: NEGATIVE
Nitrite: NEGATIVE
Protein, ur: NEGATIVE mg/dL
Specific Gravity, Urine: 1.025 (ref 1.005–1.030)
UROBILINOGEN UA: 0.2 mg/dL (ref 0.0–1.0)
pH: 6 (ref 5.0–8.0)

## 2014-04-26 LAB — COMPREHENSIVE METABOLIC PANEL
ALBUMIN: 3.6 g/dL (ref 3.5–5.2)
ALK PHOS: 81 U/L (ref 39–117)
ALT: 13 U/L (ref 0–53)
ANION GAP: 6 (ref 5–15)
AST: 23 U/L (ref 0–37)
BILIRUBIN TOTAL: 0.3 mg/dL (ref 0.3–1.2)
BUN: 12 mg/dL (ref 6–23)
CO2: 27 mmol/L (ref 19–32)
Calcium: 8.5 mg/dL (ref 8.4–10.5)
Chloride: 106 mmol/L (ref 96–112)
Creatinine, Ser: 0.78 mg/dL (ref 0.50–1.35)
GFR calc Af Amer: >90 mL/min (ref >90)
GFR calc non Af Amer: >90 mL/min (ref >90)
GLUCOSE: 154 mg/dL — AB (ref 70–99)
POTASSIUM: 3.5 mmol/L (ref 3.5–5.1)
SODIUM: 139 mmol/L (ref 135–145)
TOTAL PROTEIN: 6.8 g/dL (ref 6.0–8.3)

## 2014-04-26 LAB — TROPONIN I: Troponin I: <0.03 ng/mL (ref <0.031)

## 2014-04-26 LAB — LIPASE, BLOOD: Lipase: 23 U/L (ref 11–59)

## 2014-04-26 MED ORDER — HYDROMORPHONE HCL 1 MG/ML IJ SOLN
1.0000 mg | Freq: Once | INTRAMUSCULAR | Status: AC
  Administered 2014-04-26: 1 mg via INTRAVENOUS
  Filled 2014-04-26: qty 1

## 2014-04-26 MED ORDER — PANTOPRAZOLE SODIUM 40 MG IV SOLR
40.0000 mg | Freq: Once | INTRAVENOUS | Status: AC
  Administered 2014-04-26: 40 mg via INTRAVENOUS
  Filled 2014-04-26: qty 40

## 2014-04-26 MED ORDER — ONDANSETRON HCL 4 MG/2ML IJ SOLN
4.0000 mg | Freq: Once | INTRAMUSCULAR | Status: AC
  Administered 2014-04-26: 4 mg via INTRAVENOUS
  Filled 2014-04-26: qty 2

## 2014-04-26 NOTE — ED Notes (Signed)
Patient EKG done and given to EDP. Placed on cardiac monitor at this time.

## 2014-04-26 NOTE — Discharge Instructions (Signed)
°  SEEK IMMEDIATE MEDICAL ATTENTION IF: The pain does not go away or becomes severe, particularly over the next 8-12 hours.  A temperature above 100.54F develops.  Repeated vomiting occurs (multiple episodes).  .  Blood is being passed in stools or vomit (bright red or black tarry stools).  Return also if you develop worsening chest pain, difficulty breathing, dizziness or fainting, or become confused, poorly responsive, or inconsolable.

## 2014-04-26 NOTE — ED Notes (Signed)
Pt has had 2 sodas and some water.  Reports feeling nauseated but has not vomited.

## 2014-04-26 NOTE — ED Notes (Addendum)
Pt stating that he needs additional pain medications.  EDP at bedside to discuss with pt.

## 2014-04-26 NOTE — ED Notes (Signed)
Pt very angry that he was not prescribed additional pain medications. Reinforced with pt that chronic pain is not managed in the ER and that the most appropriate provider is his GI physician or primary doctor. Pt states, "then I'm just going to go to [facility]. Those doctors don't have a problem writing medications for me."

## 2018-08-10 ENCOUNTER — Encounter: Payer: Self-pay | Admitting: Internal Medicine

## 2018-09-04 ENCOUNTER — Encounter (HOSPITAL_COMMUNITY): Payer: Self-pay | Admitting: Emergency Medicine

## 2018-09-04 ENCOUNTER — Emergency Department (HOSPITAL_COMMUNITY)
Admission: EM | Admit: 2018-09-04 | Discharge: 2018-09-04 | Disposition: A | Discharge status: Home / Self Care | Payer: Medicare Other | Attending: Emergency Medicine | Admitting: Emergency Medicine

## 2018-09-04 ENCOUNTER — Other Ambulatory Visit: Payer: Self-pay

## 2018-09-04 DIAGNOSIS — Z79899 Other long term (current) drug therapy: Secondary | ICD-10-CM | POA: Insufficient documentation

## 2018-09-04 DIAGNOSIS — I10 Essential (primary) hypertension: Secondary | ICD-10-CM | POA: Diagnosis not present

## 2018-09-04 DIAGNOSIS — L600 Ingrowing nail: Secondary | ICD-10-CM | POA: Diagnosis present

## 2018-09-04 MED ORDER — CEPHALEXIN 500 MG PO CAPS: 500.0000 mg | ORAL_CAPSULE | Freq: Four times a day (QID) | ORAL | 0 refills | Status: DC

## 2018-09-04 MED ORDER — OXYCODONE-ACETAMINOPHEN 5-325 MG PO TABS: 1.0000 | ORAL_TABLET | ORAL | 0 refills | Status: DC | PRN

## 2018-09-04 MED ORDER — LIDOCAINE HCL (PF) 1 % IJ SOLN
5.0000 mL | Freq: Once | INTRAMUSCULAR | Status: AC
  Administered 2018-09-04: 5 mL
  Filled 2018-09-04: qty 6

## 2018-09-04 NOTE — ED Triage Notes (Signed)
Patient c/o left great toe pain due to ingrown toenail. Per patient missed appointment with podiatrist due to unforseen events. Patient unable to get another appointment, tried to remove nail himself. Per patient some drainage.

## 2018-09-04 NOTE — ED Notes (Signed)
L great toenail ingorwn for awhille   Had appt with podiatrist  Family death  Missed appt   Here for eval

## 2018-09-04 NOTE — Discharge Instructions (Addendum)
Continue soaking your toe in warm water twice daily for 15 minutes.  Take the antibiotics as discussed.  You may take the oxycodone prescribed for pain relief.  This will make you drowsy - do not drive within 4 hours of taking this medication. Call your podiatrist for definitive treatment of your toenails.

## 2018-09-04 NOTE — ED Provider Notes (Signed)
[facility] EMERGENCY DEPARTMENT Provider Note   CSN: [id number] Arrival date & time: 09/04/18  1150     History   Chief Complaint Chief Complaint  Patient presents with  . Ingrown Toenail    HPI Shawn Ayala is a 53 y.o. male with a history of HTN, migraines, GERD with h/u PUD and distant h/o substance abuse presenting with a chronically ingrown toenail which has become inflamed and painful despite trying to remove the nail edge himself prior to arrival.  He does report intermittent drainage from the cuticle edge.  He had arranged a podiatry ov in [place] for last week, but had to cancel due to the death of his brother.  He is planning to f/u with podiatry for this but the last appt took a month to obtain.  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  Denies fevers, no radiation of pain.  He wants the nail completely removed.     The history is provided by the patient.    Past Medical History:  Diagnosis Date  . Diverticulitis   . GERD (gastroesophageal reflux disease)   . Heart murmur   . Hiatal hernia   . Hypertension   . Migraines   . 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 Benign neoplasm of colon 06/14/2013  . Diverticulosis of colon (without mention of hemorrhage) 06/14/2013  . Substance abuse ([place]) 06/12/2013  . Acute esophagitis 06/12/2013  . Anemia, iron deficiency 06/11/2013  . Hypokalemia 06/11/2013  . Abdominal pain, chronic, epigastric 06/11/2013  . Gastroparesis 06/11/2013  . 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 Loss of weight 06/11/2013  . Hypertension 03/25/2011  . GERD (gastroesophageal reflux disease) 03/25/2011    Past Surgical History:  Procedure Laterality Date  . COLONOSCOPY N/A 06/14/2013   Procedure: COLONOSCOPY;  Surgeon: Gatha Mayer, MD;  Location: [place];  Service: Endoscopy;  Laterality: N/A;  .  ESOPHAGOGASTRODUODENOSCOPY N/A 06/11/2013   Procedure: ESOPHAGOGASTRODUODENOSCOPY (EGD);  Surgeon: Ladene Artist, MD;  Location: [facility] ENDOSCOPY;  Service: Endoscopy;  Laterality: N/A;  . Left inguinal hernia repair          Home Medications    Prior to Admission medications   Medication Sig Start Date End Date Taking? Authorizing Provider  acetaminophen (TYLENOL) 325 MG tablet Take 650 mg by mouth every 6 (six) hours as needed for mild pain, fever or headache.    [provider]  cephALEXin (KEFLEX) 500 MG capsule Take 1 capsule (500 mg total) by mouth 4 (four) times daily. 09/04/18   Evalee Jefferson, PA-C  cloNIDine (CATAPRES) 0.1 MG tablet Take 0.1 mg by mouth 2 (two) times daily.      [provider]  ondansetron (ZOFRAN) 4 MG tablet Take 1 tablet (4 mg total) by mouth every 6 (six) hours as needed for nausea. 06/16/13   Geradine Girt, DO  oxyCODONE-acetaminophen (PERCOCET/ROXICET) 5-325 MG tablet Take 1 tablet by mouth every 4 (four) hours as needed. 09/04/18   Yichen Gilardi, Almyra Free, PA-C  pantoprazole (PROTONIX) 40 MG tablet Take 1 tablet (40 mg total) by mouth 2 (two) times daily. 06/16/13   Geradine Girt, DO  sucralfate (CARAFATE) 1 G tablet Take 1 g by mouth 4 (four) times daily -  with meals and at bedtime.    [provider]    Family History No family history on file.  Social History Social History   Tobacco Use  . Smoking status: Never Smoker  . Smokeless tobacco: Current User    Types: Chew  . Tobacco comment: Uses snuff since age 41yr  Substance Use Topics  . Alcohol use: No  . Drug use: No     Allergies   Reglan [metoclopramide], Ativan [lorazepam], Darvocet [propoxyphene n-acetaminophen], Fentanyl, Nsaids, Tramadol, and Vistaril [hydroxyzine]   Review of Systems Review of Systems  Constitutional: Negative for chills and fever.  Musculoskeletal: Positive for arthralgias. Negative for joint swelling and myalgias.  Skin: Positive for color change.        Negative except as mentioned in HPI.   Neurological: Negative for weakness and numbness.     Physical Exam Updated Vital Signs BP (!) 166/124 (BP Location: Right Arm) Comment: states he hasn't been on htn meds for yrs  Pulse (!) 102   Temp 99.4 F (37.4 C) (Temporal)   Resp 16   Ht 5\' 2"  (1.575 m)   Wt 54.4 kg   SpO2 100%   BMI 21.95 kg/m   Physical Exam Constitutional:      Appearance: He is well-developed.  HENT:     Head: Atraumatic.  Neck:     Musculoskeletal: Normal range of motion.  Cardiovascular:     Comments: Pulses equal bilaterally Musculoskeletal:        General: Tenderness present.       Feet:     Comments: ttp left distal great toe. There is a partial nail plate removal, lateral side, but still with nail spike appreciated under the nail fold.  Mild edema, slight erythema, no purulent drainage.   Skin:    General: Skin is warm and dry.  Neurological:     Mental Status: He is alert.     Sensory: No sensory deficit.     Deep Tendon Reflexes: Reflexes normal.      ED Treatments / Results  Labs (all labs ordered are listed, but only abnormal results are displayed) Labs Reviewed - No data to display  EKG    Radiology No results found.  Procedures .Nail Removal  Date/Time: 09/04/2018 12:45 PM Performed by: Evalee Jefferson, PA-C Authorized by: Evalee Jefferson, PA-C   Consent:    Consent obtained:  Verbal   Consent given by:  Patient   Risks discussed:  Bleeding, pain, permanent nail deformity and infection   Alternatives discussed:  No treatment Location:    Foot:  L big toe Pre-procedure details:    Skin preparation:  Betadine Anesthesia (see MAR for exact dosages):    Anesthesia method:  Nerve block   Block needle gauge:  25 G   Block anesthetic:  Lidocaine 1% w/o epi   Block technique:  Digital block   Block injection procedure:  Anatomic landmarks identified and introduced needle   Block outcome:  Anesthesia achieved Nail Removal:     Nail removed:  Partial (residual spike removed from distal nail fold) Trephination:    Subungual hematoma drained: no   Ingrown nail:    Wedge excision of skin: no   Post-procedure details:    Dressing:  4x4 sterile gauze   (including critical care time)  Medications Ordered in ED Medications  lidocaine (PF) (XYLOCAINE) 1 % injection 5 mL (5 mLs Other Given 09/04/18 1221)     Initial Impression / Assessment and Plan / ED Course  I have reviewed the triage vital signs and the nursing notes.  Pertinent labs & imaging results that were available  during my care of the patient were reviewed by me and considered in my medical decision making (see chart for details).        Pt advised f/u with podiatry as planned as there appears to be a very wide nail at baseline and suspect this will continue to be a problem for him.  He may need definitive complete nail removal and matrix ablation which is not available here.  Possible early infection, no abscess or paronychia but with h/o of drainage (none here) placed on keflex.  Pt endorses sig pain, prescribed very limited quantity of oxycodone, unable to use nsaids with PUD hx.    Final Clinical Impressions(s) / ED Diagnoses   Final diagnoses:  Ingrown left big toenail    ED Discharge Orders         Ordered    cephALEXin (KEFLEX) 500 MG capsule  4 times daily     09/04/18 1308    oxyCODONE-acetaminophen (PERCOCET/ROXICET) 5-325 MG tablet  Every 4 hours PRN     09/04/18 1308           Evalee Jefferson, PA-C 09/04/18 1542    Noemi Chapel, MD 09/05/18 [phone]

## 2018-09-17 ENCOUNTER — Other Ambulatory Visit: Payer: Self-pay

## 2018-09-17 ENCOUNTER — Emergency Department (HOSPITAL_COMMUNITY): Payer: Medicare Other

## 2018-09-17 ENCOUNTER — Emergency Department (HOSPITAL_COMMUNITY)
Admission: EM | Admit: 2018-09-17 | Discharge: 2018-09-17 | Disposition: A | Discharge status: Home / Self Care | Payer: Medicare Other | Attending: Emergency Medicine | Admitting: Emergency Medicine

## 2018-09-17 ENCOUNTER — Encounter (HOSPITAL_COMMUNITY): Payer: Self-pay | Admitting: Emergency Medicine

## 2018-09-17 DIAGNOSIS — I10 Essential (primary) hypertension: Secondary | ICD-10-CM | POA: Diagnosis not present

## 2018-09-17 DIAGNOSIS — R102 Pelvic and perineal pain: Secondary | ICD-10-CM | POA: Diagnosis present

## 2018-09-17 DIAGNOSIS — R39198 Other difficulties with micturition: Secondary | ICD-10-CM | POA: Diagnosis not present

## 2018-09-17 DIAGNOSIS — R309 Painful micturition, unspecified: Secondary | ICD-10-CM | POA: Diagnosis not present

## 2018-09-17 DIAGNOSIS — Z79899 Other long term (current) drug therapy: Secondary | ICD-10-CM | POA: Diagnosis not present

## 2018-09-17 DIAGNOSIS — F17220 Nicotine dependence, chewing tobacco, uncomplicated: Secondary | ICD-10-CM | POA: Insufficient documentation

## 2018-09-17 DIAGNOSIS — R1032 Left lower quadrant pain: Secondary | ICD-10-CM | POA: Diagnosis not present

## 2018-09-17 LAB — CBC WITH DIFFERENTIAL/PLATELET
Abs Immature Granulocytes: 0.03 10*3/uL (ref 0.00–0.07)
Basophils Absolute: 0 10*3/uL (ref 0.0–0.1)
Basophils Relative: 0 %
Eosinophils Absolute: 0.3 10*3/uL (ref 0.0–0.5)
Eosinophils Relative: 4 %
HCT: 34.3 % — ABNORMAL LOW (ref 39.0–52.0)
Hemoglobin: 9.5 g/dL — ABNORMAL LOW (ref 13.0–17.0)
Immature Granulocytes: 0 %
Lymphocytes Relative: 16 %
Lymphs Abs: 1.1 10*3/uL (ref 0.7–4.0)
MCH: 19.5 pg — ABNORMAL LOW (ref 26.0–34.0)
MCHC: 27.7 g/dL — ABNORMAL LOW (ref 30.0–36.0)
MCV: 70.3 fL — ABNORMAL LOW (ref 80.0–100.0)
Monocytes Absolute: 0.4 10*3/uL (ref 0.1–1.0)
Monocytes Relative: 6 %
Neutro Abs: 5.2 10*3/uL (ref 1.7–7.7)
Neutrophils Relative %: 74 %
Platelets: 601 10*3/uL — ABNORMAL HIGH (ref 150–400)
RBC: 4.88 MIL/uL (ref 4.22–5.81)
RDW: 18.7 % — ABNORMAL HIGH (ref 11.5–15.5)
WBC: 7 10*3/uL (ref 4.0–10.5)
nRBC: 0 % (ref 0.0–0.2)

## 2018-09-17 LAB — URINALYSIS, ROUTINE W REFLEX MICROSCOPIC
Bilirubin Urine: NEGATIVE
Glucose, UA: NEGATIVE mg/dL
Hgb urine dipstick: NEGATIVE
Ketones, ur: NEGATIVE mg/dL
Leukocytes,Ua: NEGATIVE
Nitrite: NEGATIVE
Protein, ur: NEGATIVE mg/dL
Specific Gravity, Urine: 1.018 (ref 1.005–1.030)
pH: 8 (ref 5.0–8.0)

## 2018-09-17 LAB — BASIC METABOLIC PANEL
Anion gap: 11 (ref 5–15)
BUN: 14 mg/dL (ref 6–20)
CO2: 27 mmol/L (ref 22–32)
Calcium: 8.9 mg/dL (ref 8.9–10.3)
Chloride: 102 mmol/L (ref 98–111)
Creatinine, Ser: 0.68 mg/dL (ref 0.61–1.24)
GFR calc Af Amer: >60 mL/min (ref >60)
GFR calc non Af Amer: >60 mL/min (ref >60)
Glucose, Bld: 109 mg/dL — ABNORMAL HIGH (ref 70–99)
Potassium: 3.5 mmol/L (ref 3.5–5.1)
Sodium: 140 mmol/L (ref 135–145)

## 2018-09-17 MED ORDER — HYDROMORPHONE HCL 1 MG/ML IJ SOLN
1.0000 mg | Freq: Once | INTRAMUSCULAR | Status: AC
  Administered 2018-09-17: 1 mg via INTRAVENOUS
  Filled 2018-09-17: qty 1

## 2018-09-17 MED ORDER — IOHEXOL 300 MG/ML  SOLN
100.0000 mL | Freq: Once | INTRAMUSCULAR | Status: AC | PRN
  Administered 2018-09-17: 100 mL via INTRAVENOUS

## 2018-09-17 MED ORDER — HYDROMORPHONE HCL 1 MG/ML IJ SOLN
0.5000 mg | Freq: Once | INTRAMUSCULAR | Status: AC
  Administered 2018-09-17: 0.5 mg via INTRAVENOUS
  Filled 2018-09-17: qty 1

## 2018-09-17 NOTE — ED Provider Notes (Signed)
[facility] EMERGENCY DEPARTMENT Provider Note   CSN: [id number] Arrival date & time: 09/17/18  1016    History   Chief Complaint Chief Complaint  Patient presents with   Groin Pain    HPI Shawn Ayala is a 53 y.o. male.     HPI  The patient is a 53 year old male, he has a history of diverticulitis, he also has a history of a hernia repair, both inguinal and hiatal.  He works as a Theme [employer] manager and reports that yesterday while he was lifting a 100 pound bundle of roof shingles he felt acute onset of a pop in his left inguinal region followed by severe pain which is been intermittent in the last 24 hours.  He could not get comfortable last night as the pain was persistent, he is not vomiting, his stools have been normal, he did have some difficulty with urination this morning feeling like there was a slight burn to it.  The pain does not radiate to his back, it is solely in the left lower inguinal region.  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   Loss of weight 06/11/2013   Hypertension 03/25/2011   GERD (gastroesophageal reflux disease) 03/25/2011    Past Surgical History:  Procedure Laterality Date   COLONOSCOPY N/A 06/14/2013   Procedure: COLONOSCOPY;  Surgeon: Gatha Mayer, MD;  Location: [place];  Service: Endoscopy;  Laterality: N/A;   ESOPHAGOGASTRODUODENOSCOPY N/A 06/11/2013   Procedure: ESOPHAGOGASTRODUODENOSCOPY (EGD);  Surgeon: Ladene Artist, MD;  [facility];  Service: Endoscopy;  Laterality: N/A;   Left inguinal hernia repair          Home Medications    Prior to Admission medications   Medication Sig Start Date End Date Taking? Authorizing Provider  acetaminophen (TYLENOL) 325 MG tablet Take 650 mg by mouth every 6 (six) hours as needed for mild pain, fever or headache.   Yes [provider]  calcium carbonate (TUMS - DOSED IN MG ELEMENTAL CALCIUM) 500 MG chewable tablet Chew 2-3 tablets by mouth 4 (four) times daily as needed for indigestion or heartburn.    Yes [provider]  Multiple Vitamins-Minerals (SUPER THERA VITE M) TABS Take 1 tablet by mouth daily.   Yes [provider]    Family History History reviewed. No pertinent family history.  Social History Social History   Tobacco Use   Smoking status: Never Smoker   Smokeless tobacco: Current User    Types: Chew   Tobacco comment: Uses snuff since age 65yr  Substance Use Topics   Alcohol use: No   Drug use: No     Allergies   Reglan [metoclopramide], Ativan [lorazepam], Codeine, Darvocet [propoxyphene n-acetaminophen], Fentanyl, Gabapentin, Hydrocodone, Nsaids, Pregabalin, Tramadol, and Vistaril [hydroxyzine]   Review of Systems Review of Systems  All other systems reviewed and are negative.    Physical Exam Updated Vital Signs BP (!) 169/107 (BP  Location: Left Arm)    Pulse 95    Temp 97.9 F (36.6 C) (Oral)    Resp 18    Ht 1.575 m (5\' 2" )    Wt 54.4 kg    SpO2 98%    BMI 21.95 kg/m   Physical Exam Vitals signs and nursing note reviewed.  Constitutional:      General: He is not in acute distress.    Appearance: He is well-developed.  HENT:     Head: Normocephalic and atraumatic.     Mouth/Throat:     Pharynx: No oropharyngeal exudate.  Eyes:     General: No scleral icterus.       Right eye: No discharge.        Left  eye: No discharge.     Conjunctiva/sclera: Conjunctivae normal.     Pupils: Pupils are equal, round, and reactive to light.  Neck:     Musculoskeletal: Normal range of motion and neck supple.     Thyroid: No thyromegaly.     Vascular: No JVD.  Cardiovascular:     Rate and Rhythm: Normal rate and regular rhythm.     Heart sounds: Normal heart sounds. No murmur. No friction rub. No gallop.   Pulmonary:     Effort: Pulmonary effort is normal. No respiratory distress.     Breath sounds: Normal breath sounds. No wheezing or rales.  Abdominal:     General: Bowel sounds are normal. There is no distension.     Palpations: Abdomen is soft. There is no mass.     Tenderness: There is abdominal tenderness.     Comments: The abdomen is very soft, he does have some left lower quadrant as well as left inguinal tenderness.  Masses not palpated in that region  Genitourinary:    Comments: Normal-appearing scrotum penis and testicles.  There is no hernia in the scrotum testicles are normal size location and appearance, there is no tenderness. Musculoskeletal: Normal range of motion.        General: No tenderness.  Lymphadenopathy:     Cervical: No cervical adenopathy.  Skin:    General: Skin is warm and dry.     Findings: No erythema or rash.  Neurological:     Mental Status: He is alert.     Coordination: Coordination normal.  Psychiatric:        Behavior: Behavior normal.      ED Treatments / Results  Labs (all labs ordered are listed, but only abnormal results are displayed) Labs Reviewed  CBC WITH DIFFERENTIAL/PLATELET - Abnormal; Notable for the following components:      Result Value   Hemoglobin 9.5 (*)    HCT 34.3 (*)    MCV 70.3 (*)    MCH 19.5 (*)    MCHC 27.7 (*)    RDW 18.7 (*)    Platelets 601 (*)    All other components within normal limits  URINALYSIS, ROUTINE W REFLEX MICROSCOPIC - Abnormal; Notable for the following components:   APPearance CLOUDY (*)    All other  components within normal limits  BASIC METABOLIC PANEL - Abnormal; Notable for the following components:   Glucose, Bld 109 (*)    All other components within normal limits    EKG None  Radiology Ct Abdomen Pelvis W Contrast  Result Date: 09/17/2018 CLINICAL DATA:  Abdominal pain with diverticulitis suspected. Patient reports left groin pain after picking up shingles yesterday. EXAM: CT ABDOMEN AND PELVIS WITH CONTRAST TECHNIQUE: Multidetector CT  imaging of the abdomen and pelvis was performed using the standard protocol following bolus administration of intravenous contrast. CONTRAST:  193mL OMNIPAQUE IOHEXOL 300 MG/ML  SOLN COMPARISON:  07/16/2018 FINDINGS: Lower chest: Small sliding hiatal hernia. Peripherally calcified mass with adjacent clip right of the herniated stomach is stable-question postoperative seroma or excluded diverticulum. No acute finding Hepatobiliary: No focal liver abnormality.No evidence of biliary obstruction or stone. Pancreas: 21 mm centrally low-density and peripherally calcified lesion ventral from the pancreas is stable over multiple studies, likely a complicated cyst when correlated with 2016 appearance. Spleen: Unremarkable. Adrenals/Urinary Tract: Negative adrenals. No hydronephrosis or stone. Small right renal cysts. Unremarkable bladder. Stomach/Bowel: No obstruction. No appendicitis. Left colonic diverticulosis without superimposed inflammation. Distended stomach proximal to the pylorus, also seen previously but no underlying obstructive lesion is seen. Vascular/Lymphatic: No acute vascular abnormality. No mass or adenopathy. Reproductive:Negative Other: No ascites or pneumoperitoneum. Left inguinal hernia repair. No superimposed abnormality to explain acute pain. Musculoskeletal: No acute abnormalities. 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 IMPRESSION: No emergent finding.  Stable from 07/16/2018, as described. Electronically Signed   By: Monte Fantasia M.D.   On: 09/17/2018 13:05    Procedures Procedures (including critical care time)  Medications Ordered in ED Medications  HYDROmorphone (DILAUDID) injection 0.5 mg (has no administration in time range)  HYDROmorphone (DILAUDID) injection 1 mg (1 mg Intravenous Given 09/17/18 1052)  iohexol (OMNIPAQUE) 300 MG/ML solution 100 mL (100 mLs Intravenous Contrast Given 09/17/18 1229)     Initial Impression / Assessment and Plan / ED Course  I have reviewed the triage vital signs and the nursing notes.  Pertinent labs & imaging results that were available during my care of the patient were reviewed by me and considered in my medical decision making (see chart for details).  Clinical Course as of Sep 17 1350  Sat Sep 17, 2018  1152 Urinalysis neg for infection or blood, CBC with elevated platelets at 600, slight anemia at 9.5 Hgb and no leukocytosis.  BMP is normal.   [BM]  1153 Prior labs reviewed in the EMR< has had chronic anemia at this level   [BM]    Clinical Course User Index [BM] Noemi Chapel, MD       I would consider diverticulitis, inguinal incarcerated hernia either bowel or fat-containing, mesenteric adenitis, urinary tract infection or kidney stone.  We will proceed with labs and CT scan.  Patient agreeable, nonsurgical abdomen at this time.  CT scan negative, patient has normal labs except for the mild anemia, he is aware of this, stable for discharge.  He continues to ask for opiate pain medicines, has multiple allergies other than Dilaudid, no opiate prescriptions will be given at discharge  Final Clinical Impressions(s) / ED Diagnoses   Final diagnoses:  Left groin pain      Noemi Chapel, MD 09/17/18 1352

## 2018-09-17 NOTE — Discharge Instructions (Addendum)
Your testing today did not show any specific abnormal findings You can follow-up with your family doctor, if you do not have a family doctor see the list of numbers below.  There was no findings to suggest the need for surgery or opiate pain medicine thankfully.  Take Toradol or topical Voltaren gel as needed for pain  Ice and rest for 24 hours  [facility] Doctor List    Sinda Du MD. Specialty: Pulmonary Disease Contact information: [address]  [place] [address]  [phone]   Tula Nakayama, MD. Specialty: [facility] Medicine Contact information: [address] [place] [address]  [phone]   Sallee Lange, MD. Specialty: [facility] Medicine Contact information: [address]  [place] [address]  [phone]   Rosita Fire, MD Specialty: Internal Medicine Contact information: [place] [address]  [phone]   Delphina Cahill, MD. Specialty: Internal Medicine Contact information: [place] [address]  [phone]    [facility] (Dr. Maudie Mercury) Specialty: Family Medicine Contact information: [place] [address]  [phone]   Leslie Andrea, MD. Specialty: [facility] Medicine Contact information: [place] [address]  [phone]   Asencion Noble, MD. Specialty: Internal Medicine Contact information: [address]  [place] [address]  [place]  [address] [place], [place] [address] [phone]  Services [facility] offers a variety of basic health services.  Services include but are not limited to: Blood pressure checks  Heart rate checks  Blood sugar checks  Urine analysis  Rapid strep tests  Pregnancy tests.  Health education and referrals  People needing more complex services  will be directed to a physician online. Using these virtual visits, doctors can evaluate and prescribe medicine and treatments. There will be no medication on-site, though [facility] will help patients fill their prescriptions at little to no cost.   For More information please go to: [email or web address]

## 2018-09-17 NOTE — ED Triage Notes (Signed)
Pt reports having left groin pain after picking up a bunch of shingles yesterday. Also having burning with urination.

## 2018-09-27 ENCOUNTER — Other Ambulatory Visit: Payer: Self-pay

## 2018-09-27 ENCOUNTER — Emergency Department (HOSPITAL_COMMUNITY)
Admission: EM | Admit: 2018-09-27 | Discharge: 2018-09-27 | Disposition: A | Discharge status: Home / Self Care | Payer: Medicare Other | Attending: Emergency Medicine | Admitting: Emergency Medicine

## 2018-09-27 ENCOUNTER — Encounter (HOSPITAL_COMMUNITY): Payer: Self-pay | Admitting: Emergency Medicine

## 2018-09-27 DIAGNOSIS — L600 Ingrowing nail: Secondary | ICD-10-CM

## 2018-09-27 DIAGNOSIS — F17220 Nicotine dependence, chewing tobacco, uncomplicated: Secondary | ICD-10-CM | POA: Diagnosis not present

## 2018-09-27 DIAGNOSIS — I10 Essential (primary) hypertension: Secondary | ICD-10-CM | POA: Insufficient documentation

## 2018-09-27 DIAGNOSIS — Z79899 Other long term (current) drug therapy: Secondary | ICD-10-CM | POA: Diagnosis not present

## 2018-09-27 MED ORDER — LIDOCAINE HCL (PF) 2 % IJ SOLN
INTRAMUSCULAR | Status: AC
  Filled 2018-09-27: qty 20

## 2018-09-27 MED ORDER — LIDOCAINE HCL (PF) 2 % IJ SOLN
10.0000 mL | Freq: Once | INTRAMUSCULAR | Status: AC
  Administered 2018-09-27: 10 mL

## 2018-09-27 MED ORDER — OXYCODONE-ACETAMINOPHEN 5-325 MG PO TABS
1.0000 | ORAL_TABLET | Freq: Once | ORAL | Status: AC
  Administered 2018-09-27: 22:00:00 1 via ORAL
  Filled 2018-09-27: qty 1

## 2018-09-27 NOTE — ED Provider Notes (Signed)
[facility] EMERGENCY DEPARTMENT Provider Note   CSN: [id number] Arrival date & time: 09/27/18  1818     History   Chief Complaint Chief Complaint  Patient presents with  . Ingrown Toenail    HPI Shawn Ayala is a 53 y.o. male presented for evaluation of right great toe pain.  Patient states he has issues with ingrown toenails.  Today he dropped a bundle of shingles on his toe, causing his ingrown toenail pain to flare.  He reports there was bleeding at the time, but no bleeding currently.  He had his left great toenail removed several weeks ago.  He has an appointment with a podiatrist next week.     HPI  Past Medical History:  Diagnosis Date  . Diverticulitis   . GERD (gastroesophageal reflux disease)   . Heart murmur   . Hiatal hernia   . Hypertension   . Migraines   . 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 Benign neoplasm of colon 06/14/2013  . Diverticulosis of colon (without mention of hemorrhage) 06/14/2013  . Substance abuse ([place]) 06/12/2013  . Acute esophagitis 06/12/2013  . Anemia, iron deficiency 06/11/2013  . Hypokalemia 06/11/2013  . Abdominal pain, chronic, epigastric 06/11/2013  . Gastroparesis 06/11/2013  . 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 Loss of weight 06/11/2013  . Hypertension 03/25/2011  . GERD (gastroesophageal reflux disease) 03/25/2011    Past Surgical History:  Procedure Laterality Date  . COLONOSCOPY N/A 06/14/2013   Procedure: COLONOSCOPY;  Surgeon: Gatha Mayer, MD;  Location: [place];  Service: Endoscopy;  Laterality: N/A;  . ESOPHAGOGASTRODUODENOSCOPY N/A 06/11/2013   Procedure: ESOPHAGOGASTRODUODENOSCOPY (EGD);  Surgeon: Ladene Artist, MD;  Location: [facility] ENDOSCOPY;  Service: Endoscopy;  Laterality: N/A;  . Left inguinal hernia repair          Home Medications    Prior to Admission medications   Medication Sig Start Date End Date Taking? Authorizing Provider   acetaminophen (TYLENOL) 325 MG tablet Take 650 mg by mouth every 6 (six) hours as needed for mild pain, fever or headache.    [provider]  calcium carbonate (TUMS - DOSED IN MG ELEMENTAL CALCIUM) 500 MG chewable tablet Chew 2-3 tablets by mouth 4 (four) times daily as needed for indigestion or heartburn.     [provider]  Multiple Vitamins-Minerals (SUPER THERA VITE M) TABS Take 1 tablet by mouth daily.    [provider]    Family History No family history on file.  Social History Social History   Tobacco Use  . Smoking status: Never Smoker  . Smokeless tobacco: Current User    Types: Chew  . Tobacco comment: Uses snuff since age 23yr  Substance Use Topics  . Alcohol use: No  . Drug use: No     Allergies   Reglan [metoclopramide], Ativan [lorazepam], Codeine, Darvocet [propoxyphene n-acetaminophen], Fentanyl, Gabapentin, Hydrocodone, Nsaids, Pregabalin, Tramadol, and Vistaril [hydroxyzine]   Review of Systems Review of Systems  Musculoskeletal: Positive for arthralgias.  Hematological: Does not bruise/bleed easily.     Physical Exam Updated Vital Signs BP (!) 155/102 (BP Location: Right Arm)   Pulse 76   Temp 97.6 F (36.4 C) (Oral)   Resp 18   Ht 5\' 2"  (1.575 m)   Wt 54.4 kg   SpO2 100%   BMI 21.95 kg/m   Physical Exam Vitals signs and nursing note reviewed.  Constitutional:  General: He is not in acute distress.    Appearance: He is well-developed.  HENT:     Head: Normocephalic and atraumatic.  Neck:     Musculoskeletal: Normal range of motion.  Pulmonary:     Effort: Pulmonary effort is normal.  Abdominal:     General: There is no distension.  Musculoskeletal: Normal range of motion.     Comments: Pain on the medial side of the right great toenail.  No significant surrounding erythema.  No paronychia.  No significant swelling or signs consistent with fracture.  Skin:    General: Skin is warm.     Findings: No  rash.  Neurological:     Mental Status: He is alert and oriented to person, place, and time.      ED Treatments / Results  Labs (all labs ordered are listed, but only abnormal results are displayed) Labs Reviewed - No data to display  EKG None  Radiology No results found.  Procedures Excise ingrown toenail  Date/Time: 09/27/2018 10:11 PM Performed by: Franchot Heidelberg, PA-C Authorized by: Franchot Heidelberg, PA-C  Consent: Verbal consent obtained. Risks and benefits: risks, benefits and alternatives were discussed Consent given by: patient Patient understanding: patient states understanding of the procedure being performed Patient consent: the patient's understanding of the procedure matches consent given Procedure consent: procedure consent matches procedure scheduled Relevant documents: relevant documents present and verified Patient identity confirmed: verbally with patient Time out: Immediately prior to procedure a "time out" was called to verify the correct patient, procedure, equipment, support staff and site/side marked as required. Preparation: Patient was prepped and draped in the usual sterile fashion. Local anesthesia used: yes Anesthesia: digital block  Anesthesia: Local anesthesia used: yes Local Anesthetic: lidocaine 2% without epinephrine Anesthetic total: 6 mL  Sedation: Patient sedated: no  Patient tolerance: patient tolerated the procedure well with no immediate complications Comments: Digital block performed with epinephrine.  Patient had complete anesthesia.  Medial half of the great toenail separated from the nail bed using suture scissors and removed with needle driver and suture scissors.  Patient tolerated well.    (including critical care time)  Medications Ordered in ED Medications  lidocaine (XYLOCAINE) 2 % injection 10 mL (10 mLs Other Given 09/27/18 2131)  oxyCODONE-acetaminophen (PERCOCET/ROXICET) 5-325 MG per tablet 1 tablet (1 tablet  Oral Given 09/27/18 2131)     Initial Impression / Assessment and Plan / ED Course  I have reviewed the triage vital signs and the nursing notes.  Pertinent labs & imaging results that were available during my care of the patient were reviewed by me and considered in my medical decision making (see chart for details).        Pt presenting for evaluation of right great toenail pain consistent with ingrown toenail.  Physical exam shows patient who is neurovascular intact.  No signs of infection or trauma.  Toenail excised as described above.  Encouraged follow-up with podiatry.  Patient requesting narcotics for home.  PMP checked, patient has had multiple narcotics from multiple different providers and locations.  I discussed with patient that I do not believe he needs narcotics for this at home.  1 dose given prior to leaving.  Discussed wound care.  At this time, patient appears safe for discharge.  Return precautions given.  Patient states he understands and agrees to plan.   Final Clinical Impressions(s) / ED Diagnoses   Final diagnoses:  Ingrown toenail    ED Discharge Orders    None  Franchot Heidelberg, PA-C 09/27/18 2213    Maudie Flakes, MD 10/03/18 [phone]

## 2018-09-27 NOTE — ED Triage Notes (Signed)
Pain to ingrown toenail to RT great toe

## 2018-09-27 NOTE — Discharge Instructions (Signed)
Continue taking your antibiotics as prescribed. Use Tylenol as needed for pain. Use ice to help with pain and swelling. Follow-up with your podiatrist for your scheduled appointment. Return to the emergency room with any new, worsening, or concerning issues.

## 2018-09-27 NOTE — ED Notes (Signed)
Non stick telfa dressing applied

## 2018-09-29 ENCOUNTER — Other Ambulatory Visit: Payer: Self-pay

## 2018-09-29 ENCOUNTER — Emergency Department (HOSPITAL_COMMUNITY)
Admission: EM | Admit: 2018-09-29 | Discharge: 2018-09-29 | Disposition: A | Discharge status: Home / Self Care | Payer: Medicare Other | Attending: Emergency Medicine | Admitting: Emergency Medicine

## 2018-09-29 ENCOUNTER — Encounter (HOSPITAL_COMMUNITY): Payer: Self-pay | Admitting: Emergency Medicine

## 2018-09-29 DIAGNOSIS — Z79899 Other long term (current) drug therapy: Secondary | ICD-10-CM | POA: Insufficient documentation

## 2018-09-29 DIAGNOSIS — G8918 Other acute postprocedural pain: Secondary | ICD-10-CM | POA: Diagnosis not present

## 2018-09-29 DIAGNOSIS — M79674 Pain in right toe(s): Secondary | ICD-10-CM | POA: Diagnosis present

## 2018-09-29 DIAGNOSIS — L600 Ingrowing nail: Secondary | ICD-10-CM | POA: Insufficient documentation

## 2018-09-29 DIAGNOSIS — M79675 Pain in left toe(s): Secondary | ICD-10-CM | POA: Insufficient documentation

## 2018-09-29 NOTE — ED Triage Notes (Signed)
Pt reports that yesterday, podiatrist helped remove two ingrown toenails in patient. Pt states that he was told to take Tylenol to control the pain. Pt reports worsening pain.

## 2018-09-29 NOTE — ED Provider Notes (Signed)
[facility] EMERGENCY DEPARTMENT Provider Note   CSN: [id number] Arrival date & time: 09/29/18  7425     History   Chief Complaint Chief Complaint  Patient presents with   Toe Pain    HPI Shawn Ayala is a 53 y.o. male.     HPI  Pt was seen at [place]. Per pt, c/o gradual onset and persistence of constant "pains" in both great toes/toenails. Pt states he had "both toenails removed" yesterday at [facility] office. Pt states he was "only prescribed an antibiotic" (Keflex). Pt states the DPM "told me to just take tylenol" and "didn't prescribe me anything for pain."  States he took APAP PTA, but "that's not working" and is here in the ED today requesting a prescription for pain medicine. The symptoms have been associated with no other complaints. The patient has a significant history of similar symptoms previously, recently being evaluated for this complaint and multiple prior evals for same, most recently seen in the ED 2 days ago for removal of his right medial toenail.     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   Loss of weight 06/11/2013   Hypertension 03/25/2011   GERD (gastroesophageal reflux disease) 03/25/2011    Past Surgical History:  Procedure Laterality Date   COLONOSCOPY N/A 06/14/2013   Procedure: COLONOSCOPY;  Surgeon: Gatha Mayer, MD;  Location: [place];  Service: Endoscopy;   Laterality: N/A;   ESOPHAGOGASTRODUODENOSCOPY N/A 06/11/2013   Procedure: ESOPHAGOGASTRODUODENOSCOPY (EGD);  Surgeon: Ladene Artist, MD;  Location: [facility] ENDOSCOPY;  Service: Endoscopy;  Laterality: N/A;   Left inguinal hernia repair          Home Medications    Prior to Admission medications   Medication Sig Start Date End Date Taking? Authorizing Provider  acetaminophen (TYLENOL) 325 MG tablet Take 650 mg by mouth every 6 (six) hours as needed for mild pain, fever or headache.    [provider]  calcium carbonate (TUMS - DOSED IN MG ELEMENTAL CALCIUM) 500 MG chewable tablet Chew 2-3 tablets by mouth 4 (four) times daily as needed for indigestion or heartburn.     [provider]  Multiple Vitamins-Minerals (SUPER THERA VITE M) TABS Take 1 tablet by mouth daily.    [provider]    Family History No family history on file.  Social History Social History   Tobacco Use   Smoking status: Never Smoker   Smokeless tobacco: Current User    Types: Chew   Tobacco comment: Uses snuff since age 3yr  Substance Use Topics   Alcohol use: No   Drug use: No     Allergies   Reglan [metoclopramide], Ativan [lorazepam], Codeine, Darvocet [propoxyphene n-acetaminophen], Fentanyl, Gabapentin, Hydrocodone, Nsaids, Pregabalin, Tramadol, and Vistaril [hydroxyzine]   Review of Systems Review of Systems ROS: Statement: All systems negative except as marked or noted in the HPI; Constitutional: Negative for fever and chills. ; ;  Eyes: Negative for eye pain, redness and discharge. ; ; ENMT: Negative for ear pain, hoarseness, nasal congestion, sinus pressure and sore throat. ; ; Cardiovascular: Negative for chest pain, palpitations, diaphoresis, dyspnea and peripheral edema. ; ; Respiratory: Negative for cough, wheezing and stridor. ; ; Gastrointestinal: Negative for nausea, vomiting, diarrhea, abdominal pain, blood in stool, hematemesis, jaundice and rectal bleeding.  . ; ; Genitourinary: Negative for dysuria, flank pain and hematuria. ; ; Musculoskeletal: +s/p toenails removal. Negative for back pain and neck pain. Negative for swelling and trauma.; ; Skin: Negative for pruritus, rash, abrasions, blisters, bruising and skin lesion.; ; Neuro: Negative for headache, lightheadedness and neck stiffness. Negative for weakness, altered level of consciousness, altered mental status, extremity weakness, paresthesias, involuntary movement, seizure and syncope.       Physical Exam Updated Vital Signs Ht 5\' 2"  (1.575 m)    Wt 54.4 kg    BMI 21.95 kg/m   Physical Exam 0830: Physical examination:  Nursing notes reviewed; Vital signs and O2 SAT reviewed;  Constitutional: Well developed, Well nourished, Well hydrated, In no acute distress; Head:  Normocephalic, atraumatic; Eyes: EOMI, PERRL, No scleral icterus; ENMT: Mouth and pharynx normal, Mucous membranes moist; Neck: Supple, Full range of motion, No lymphadenopathy; Cardiovascular: Regular rate and rhythm, No gallop; Respiratory: Breath sounds clear & equal bilaterally, No wheezes.  Speaking full sentences with ease, Normal respiratory effort/excursion; Chest: Nontender, Movement normal; Abdomen: Soft, Nontender, Nondistended, Normal bowel sounds; Genitourinary: No CVA tenderness; Extremities: Peripheral pulses normal, +bilat great toes with medial toenails removed, wound beds clean/dry, no drainage, no bleeding, no surrounding erythema, no ecchymosis, no deformity.  No other open wounds. No edema, No calf edema or asymmetry.; Neuro: AA&Ox3, Major CN grossly intact.  Speech clear. No gross focal motor or sensory deficits in extremities.; Skin: Color normal, Warm, Dry.   ED Treatments / Results  Labs (all labs ordered are listed, but only abnormal results are displayed)   EKG None  Radiology   Procedures Procedures (including critical care time)  Medications Ordered in ED Medications - No data to  display   Initial Impression / Assessment and Plan / ED Course  I have reviewed the triage vital signs and the nursing notes.  Pertinent labs & imaging results that were available during my care of the patient were reviewed by me and considered in my medical decision making (see chart for details).     MDM Reviewed: previous chart, nursing note and vitals    28:  Agricultural consultant and I evaluated pt together:  HPI as above. Wounds without s/s of infection, pt states he has been taking keflex as prescribed. Pt asked repeatedly if he was prescribed any other medications by DPM and pt adamantly denie each time he was asked. [place] PMP Database accessed: pt filled hydrocodone 5/APAP 325mg  tabs, #12, rx by Dr. Irving Shows (DPM), yesterday 09/28/2018. Since 05/2018: pt has filled 6 narcotic prescriptions, written by 6 different providers, and filled and 2 different pharmacies in 2 different states ([place] and [place]). Pt made aware of these findings, and that he will not be receiving any narcotics today. Pt agitated, left the ED. High concern for drug seeking behavior.     Final Clinical Impressions(s) / ED Diagnoses   Final diagnoses:  Ingrown toenail of both feet    ED Discharge Orders    None       Francine Graven, DO 09/30/18 0076

## 2018-09-29 NOTE — Discharge Instructions (Signed)
Take the prescription for pain medication that you filled yesterday as directed. Continue to take your antibiotics as prescribed, as well as perform wound care as instructed by your Podiatrist.  Call your regular Podiatrist today to schedule a follow up appointment within the next 3 days.  Return to the Emergency Department immediately sooner if worsening.

## 2018-10-04 ENCOUNTER — Emergency Department (HOSPITAL_COMMUNITY)
Admission: EM | Admit: 2018-10-04 | Discharge: 2018-10-04 | Discharge status: Against Medical Advice | Payer: Medicare Other | Attending: Emergency Medicine | Admitting: Emergency Medicine

## 2018-10-04 ENCOUNTER — Other Ambulatory Visit: Payer: Self-pay

## 2018-10-04 ENCOUNTER — Encounter (HOSPITAL_COMMUNITY): Payer: Self-pay

## 2018-10-04 DIAGNOSIS — Z5321 Procedure and treatment not carried out due to patient leaving prior to being seen by health care provider: Secondary | ICD-10-CM | POA: Insufficient documentation

## 2018-10-04 DIAGNOSIS — G8929 Other chronic pain: Secondary | ICD-10-CM | POA: Diagnosis not present

## 2018-10-04 DIAGNOSIS — Z765 Malingerer [conscious simulation]: Secondary | ICD-10-CM | POA: Diagnosis not present

## 2018-10-04 DIAGNOSIS — M545 Low back pain: Secondary | ICD-10-CM | POA: Diagnosis present

## 2018-10-04 HISTORY — DX: Other chronic pain: G89.29

## 2018-10-04 HISTORY — DX: Opioid abuse, uncomplicated: F11.10

## 2018-10-04 HISTORY — DX: Malingerer (conscious simulation): Z76.5

## 2018-10-04 NOTE — ED Triage Notes (Signed)
Pt reports lifting something heavy at work and "feeling a pop in his back". Reports thoracic and lumbar pain with radiation to right hip. Pt ambulatory to triage.

## 2018-10-04 NOTE — ED Notes (Signed)
Pt storming down hallway stating "This is fucking stupid, I need something for pain".

## 2018-10-04 NOTE — ED Provider Notes (Signed)
[facility] EMERGENCY DEPARTMENT Provider Note   CSN: [id number] Arrival date & time: 10/04/18  2003     History   Chief Complaint Chief Complaint  Patient presents with   Back Pain    HPI Shawn Ayala is a 53 y.o. male.     HPI  Pt was seen at 2110. Per pt, c/o gradual onset and persistence of constant acute flair of his chronic low back "pain" for the past day.  Denies any change in his usual chronic pain pattern.  Pain worsens with palpation of the area and body position changes. Pt states he was "lifting something heavy and then dropped it" when his pain began. Denies incont/retention of bowel or bladder, no saddle anesthesia, no focal motor weakness, no tingling/numbness in extremities, no fevers, no direct injury, no abd pain.   The symptoms have been associated with no other complaints. The patient has a significant history of similar symptoms previously, recently being evaluated for this complaint and multiple prior evals for same.    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    Patient Active Problem List   Diagnosis Date Noted   Benign neoplasm of colon 06/14/2013   Diverticulosis of colon (without mention of hemorrhage) 06/14/2013   Substance abuse ([place]) 06/12/2013   Acute esophagitis 06/12/2013   Anemia, iron deficiency 06/11/2013   Hypokalemia 06/11/2013   Abdominal pain, chronic, epigastric 06/11/2013   Gastroparesis 06/11/2013   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   Loss of weight 06/11/2013   Hypertension 03/25/2011   GERD (gastroesophageal reflux disease) 03/25/2011    Past Surgical History:  Procedure Laterality Date   COLONOSCOPY N/A 06/14/2013   Procedure:  COLONOSCOPY;  Surgeon: Gatha Mayer, MD;  Location: [place];  Service: Endoscopy;  Laterality: N/A;   ESOPHAGOGASTRODUODENOSCOPY N/A 06/11/2013   Procedure: ESOPHAGOGASTRODUODENOSCOPY (EGD);  Surgeon: Ladene Artist, MD;  Location: [facility] END[facility]COPY;  Service: Endoscopy;  Laterality: N/A;   Left inguinal hernia repair          Home Medications    Prior to Admission medications   Medication Sig Start Date End Date Taking? Authorizing Provider  acetaminophen (TYLENOL) 325 MG tablet Take 650 mg by mouth every 6 (six) hours as needed for mild pain, fever or headache.    [provider]  calcium carbonate (TUMS - DOSED IN MG ELEMENTAL CALCIUM) 500 MG chewable tablet Chew 2-3 tablets by mouth 4 (four) times daily as needed for indigestion or heartburn.     [provider]  Multiple Vitamins-Minerals (SUPER THERA VITE M) TABS Take 1 tablet by mouth daily.    [provider]    Family History No family history on file.  Social History Social History   Tobacco Use   Smoking status: Never Smoker   Smokeless tobacco: Current User    Types: Chew   Tobacco comment: Uses snuff since age 2yr  Substance Use Topics   Alcohol use: No   Drug use: No     Allergies   Reglan [metoclopramide], Ativan [lorazepam], Codeine, Darvocet [propoxyphene n-acetaminophen], Fentanyl, Gabapentin, Hydrocodone, Nsaids, Pregabalin, Tramadol, and Vistaril [hydroxyzine]   Review of Systems Review of Systems  ROS: Statement: All systems negative except as marked or noted in the HPI; Constitutional: Negative for fever and chills. ; ; Eyes: Negative for eye pain, redness and discharge. ; ; ENMT: Negative for ear pain, hoarseness, nasal congestion, sinus pressure and sore throat. ; ; Cardiovascular: Negative for chest pain, palpitations, diaphoresis, dyspnea and peripheral edema. ; ; Respiratory: Negative for cough, wheezing and stridor. ; ; Gastrointestinal: Negative for nausea,  vomiting, diarrhea, abdominal pain, blood in stool, hematemesis, jaundice and rectal bleeding. . ; ; Genitourinary: Negative for dysuria, flank pain and hematuria. ; ; Musculoskeletal: +LBP. Negative for neck pain. Negative for swelling and trauma.; ; Skin: Negative for pruritus, rash, abrasions, blisters, bruising and skin lesion.; ; Neuro: Negative for headache, lightheadedness and neck stiffness. Negative for weakness, altered level of consciousness, altered mental status, extremity weakness, paresthesias, involuntary movement, seizure and syncope.       Physical Exam Updated Vital Signs BP (!) 147/99 (BP Location: Right Arm)    Pulse (!) 111    Temp 98.2 F (36.8 C) (Oral)    Resp 18    Ht 5\' 2"  (1.575 m)    Wt 54.4 kg    SpO2 100%    BMI 21.95 kg/m   Physical Exam 2115: Physical examination:  Nursing notes reviewed; Vital signs and O2 SAT reviewed;  Constitutional: Well developed, Well nourished, Well hydrated, In no acute distress; Head:  Normocephalic, atraumatic; Eyes: EOMI, PERRL, No scleral icterus; ENMT: Mouth and pharynx normal, Mucous membranes moist; Neck: Supple, Full range of motion, No lymphadenopathy; Cardiovascular: Regular rate and rhythm, No gallop; Respiratory: Breath sounds clear & equal bilaterally, No wheezes.  Speaking full sentences with ease, Normal respiratory effort/excursion; Chest: Nontender, Movement normal; Abdomen: Soft, Nontender, Nondistended, Normal bowel sounds; Genitourinary: No CVA tenderness; Spine:  No midline CS, TS, LS tenderness. +TTP right lumbar paraspinal muscles.;;  Extremities: Peripheral pulses normal, No tenderness, No edema, No calf edema or asymmetry.; Neuro: AA&Ox3, Major CN grossly intact.  Speech clear. No gross focal motor or sensory deficits in extremities. Climbs on and off stretcher easily by himself. Gait steady..; Skin: Color normal, Warm, Dry.   ED Treatments / Results  Labs (all labs ordered are listed, but only abnormal results are  displayed)   EKG None  Radiology   Procedures Procedures (including critical care time)  Medications Ordered in ED Medications - No data to display   Initial Impression / Assessment and Plan / ED Course  I have reviewed the triage vital signs and the nursing notes.  Pertinent labs & imaging results that were available during my care of the patient were reviewed by me and considered in my medical decision making (see chart for details).     MDM Reviewed: previous chart, nursing note and vitals    2115:  Pt stated he "needs a shot for pain." I offered pt XR, tylenol and motrin, as well as a muscle relaxer. Pt refused, again, stating he "needed something stronger" and "a shot." I explained that he will not be receiving any narcotic medication while in the ED today. Pt then walked out of the room and out of the ED, loudly cursing EDP and ED staff.  Concerned for drug seeking behavior.       Final Clinical Impressions(s) / ED Diagnoses   Final diagnoses:  Drug-seeking behavior  Chronic right-sided low back pain without sciatica    ED Discharge Orders    None       Francine Graven, DO 10/07/18  1547 ° °

## 2018-10-30 ENCOUNTER — Encounter (HOSPITAL_COMMUNITY): Payer: Self-pay | Admitting: Emergency Medicine

## 2018-10-30 ENCOUNTER — Emergency Department (HOSPITAL_COMMUNITY): Payer: Medicare Other

## 2018-10-30 ENCOUNTER — Emergency Department (HOSPITAL_COMMUNITY)
Admission: EM | Admit: 2018-10-30 | Discharge: 2018-10-30 | Disposition: A | Discharge status: Home / Self Care | Payer: Medicare Other | Attending: Emergency Medicine | Admitting: Emergency Medicine

## 2018-10-30 ENCOUNTER — Other Ambulatory Visit: Payer: Self-pay

## 2018-10-30 DIAGNOSIS — K209 Esophagitis, unspecified without bleeding: Secondary | ICD-10-CM

## 2018-10-30 DIAGNOSIS — I10 Essential (primary) hypertension: Secondary | ICD-10-CM | POA: Diagnosis not present

## 2018-10-30 DIAGNOSIS — K3184 Gastroparesis: Secondary | ICD-10-CM | POA: Diagnosis not present

## 2018-10-30 DIAGNOSIS — Z79899 Other long term (current) drug therapy: Secondary | ICD-10-CM | POA: Insufficient documentation

## 2018-10-30 DIAGNOSIS — R109 Unspecified abdominal pain: Secondary | ICD-10-CM | POA: Diagnosis present

## 2018-10-30 LAB — COMPREHENSIVE METABOLIC PANEL
ALT: 15 U/L (ref 0–44)
AST: 20 U/L (ref 15–41)
Albumin: 3.8 g/dL (ref 3.5–5.0)
Alkaline Phosphatase: 65 U/L (ref 38–126)
Anion gap: 12 (ref 5–15)
BUN: 12 mg/dL (ref 6–20)
CO2: 26 mmol/L (ref 22–32)
Calcium: 9.6 mg/dL (ref 8.9–10.3)
Chloride: 102 mmol/L (ref 98–111)
Creatinine, Ser: 0.7 mg/dL (ref 0.61–1.24)
GFR calc Af Amer: >60 mL/min (ref >60)
GFR calc non Af Amer: >60 mL/min (ref >60)
Glucose, Bld: 133 mg/dL — ABNORMAL HIGH (ref 70–99)
Potassium: 3.8 mmol/L (ref 3.5–5.1)
Sodium: 140 mmol/L (ref 135–145)
Total Bilirubin: 0.7 mg/dL (ref 0.3–1.2)
Total Protein: 7.1 g/dL (ref 6.5–8.1)

## 2018-10-30 LAB — URINALYSIS, ROUTINE W REFLEX MICROSCOPIC
Bilirubin Urine: NEGATIVE
Glucose, UA: NEGATIVE mg/dL
Hgb urine dipstick: NEGATIVE
Ketones, ur: 20 mg/dL — AB
Leukocytes,Ua: NEGATIVE
Nitrite: NEGATIVE
Protein, ur: NEGATIVE mg/dL
Specific Gravity, Urine: 1.013 (ref 1.005–1.030)
pH: 9 — ABNORMAL HIGH (ref 5.0–8.0)

## 2018-10-30 LAB — CBC
HCT: 36.2 % — ABNORMAL LOW (ref 39.0–52.0)
Hemoglobin: 10 g/dL — ABNORMAL LOW (ref 13.0–17.0)
MCH: 18.8 pg — ABNORMAL LOW (ref 26.0–34.0)
MCHC: 27.6 g/dL — ABNORMAL LOW (ref 30.0–36.0)
MCV: 68.2 fL — ABNORMAL LOW (ref 80.0–100.0)
Platelets: 609 10*3/uL — ABNORMAL HIGH (ref 150–400)
RBC: 5.31 MIL/uL (ref 4.22–5.81)
RDW: 19.4 % — ABNORMAL HIGH (ref 11.5–15.5)
WBC: 6 10*3/uL (ref 4.0–10.5)
nRBC: 0 % (ref 0.0–0.2)

## 2018-10-30 LAB — RAPID URINE DRUG SCREEN, HOSP PERFORMED
Amphetamines: NOT DETECTED
Barbiturates: NOT DETECTED
Benzodiazepines: POSITIVE — AB
Cocaine: NOT DETECTED
Opiates: NOT DETECTED
Tetrahydrocannabinol: POSITIVE — AB

## 2018-10-30 LAB — LIPASE, BLOOD: Lipase: 28 U/L (ref 11–51)

## 2018-10-30 MED ORDER — SODIUM CHLORIDE 0.9 % IV BOLUS
1000.0000 mL | Freq: Once | INTRAVENOUS | Status: AC
  Administered 2018-10-30: 1000 mL via INTRAVENOUS

## 2018-10-30 MED ORDER — FAMOTIDINE IN NACL 20-0.9 MG/50ML-% IV SOLN
20.0000 mg | Freq: Once | INTRAVENOUS | Status: AC
  Administered 2018-10-30: 20 mg via INTRAVENOUS
  Filled 2018-10-30: qty 50

## 2018-10-30 MED ORDER — IOHEXOL 300 MG/ML  SOLN
30.0000 mL | Freq: Once | INTRAMUSCULAR | Status: AC | PRN
  Administered 2018-10-30: 30 mL via ORAL

## 2018-10-30 MED ORDER — MORPHINE SULFATE (PF) 4 MG/ML IV SOLN
4.0000 mg | Freq: Once | INTRAVENOUS | Status: AC
  Administered 2018-10-30: 18:00:00 4 mg via INTRAVENOUS
  Filled 2018-10-30: qty 1

## 2018-10-30 MED ORDER — OXYCODONE-ACETAMINOPHEN 5-325 MG PO TABS: 1.0000 | ORAL_TABLET | ORAL | 0 refills | Status: DC | PRN

## 2018-10-30 MED ORDER — MORPHINE SULFATE (PF) 4 MG/ML IV SOLN
4.0000 mg | Freq: Once | INTRAVENOUS | Status: AC
  Administered 2018-10-30: 4 mg via INTRAVENOUS
  Filled 2018-10-30: qty 1

## 2018-10-30 MED ORDER — IOHEXOL 300 MG/ML  SOLN
100.0000 mL | Freq: Once | INTRAMUSCULAR | Status: AC | PRN
  Administered 2018-10-30: 100 mL via INTRAVENOUS

## 2018-10-30 MED ORDER — MORPHINE SULFATE (PF) 4 MG/ML IV SOLN
4.0000 mg | INTRAVENOUS | Status: AC | PRN
  Administered 2018-10-30 (×2): 4 mg via INTRAVENOUS
  Filled 2018-10-30 (×2): qty 1

## 2018-10-30 NOTE — ED Notes (Signed)
EDP in to speak with pt

## 2018-10-30 NOTE — Discharge Instructions (Addendum)
CT scan showed esophagitis and gastroparesis which you have already had.  No bowel obstruction.  Follow-up with your primary care doctor.  Pain medicine.

## 2018-10-30 NOTE — ED Provider Notes (Signed)
[facility] EMERGENCY DEPARTMENT Provider Note   CSN: [id number] Arrival date & time: 10/30/18  1022     History   Chief Complaint Chief Complaint  Patient presents with   Abdominal Pain    HPI Shawn Ayala is a 53 y.o. male.     HPI  Pt was seen at 1245.  Per pt, c/o gradual onset and persistence of constant generalized abd "pain" for the past 1 week.  Has been associated with multiple intermittent episodes of N/V and "no BM" for the past 5 days.  Describes the abd pain as "aching."  Pt states he took an OTC laxative and stool softener but cannot recall what the names of the medications were and cannot remember when he took them. Denies diarrhea, no fevers, no back pain, no rash, no CP/SOB, no black or blood in stools or emesis, no injury, no dysuria/hematuria, no testicular pain/swelling.    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    Patient Active Problem List   Diagnosis Date Noted   Benign neoplasm of colon 06/14/2013   Diverticulosis of colon (without mention of hemorrhage) 06/14/2013   Substance abuse ([place]) 06/12/2013   Acute esophagitis 06/12/2013   Anemia, iron deficiency 06/11/2013   Hypokalemia 06/11/2013   Abdominal pain, chronic, epigastric 06/11/2013   Gastroparesis 06/11/2013   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   Loss of weight 06/11/2013   Hypertension 03/25/2011   GERD (gastroesophageal reflux disease) 03/25/2011    Past Surgical History:  Procedure Laterality Date   COLONOSCOPY N/A 06/14/2013   Procedure: COLONOSCOPY;  Surgeon: Gatha Mayer, MD;  Location: [place];  Service: Endoscopy;  Laterality: N/A;   ESOPHAGOGASTRODUODENOSCOPY N/A 06/11/2013   Procedure: ESOPHAGOGASTRODUODENOSCOPY (EGD);  Surgeon: Ladene Artist, MD;  Location: [facility] ENDOSCOPY;  Service: Endoscopy;  Laterality: N/A;   Left inguinal hernia repair          Home Medications    Prior to Admission medications   Medication Sig Start Date End Date Taking? Authorizing Provider  acetaminophen (TYLENOL) 325 MG tablet Take 650 mg by mouth every 6 (six) hours as needed for mild pain, fever or headache.    [provider]  calcium carbonate (TUMS - DOSED IN MG ELEMENTAL CALCIUM) 500 MG chewable tablet Chew 2-3 tablets by mouth 4 (four) times daily as needed for indigestion or heartburn.     [provider]  Multiple Vitamins-Minerals (SUPER THERA VITE M) TABS Take 1 tablet by mouth daily.    [provider]    Family History History reviewed. No pertinent family history.  Social History Social History   Tobacco Use   Smoking status: Never Smoker   Smokeless tobacco: Current User    Types: Chew   Tobacco comment: Uses snuff since age 58yr  Substance Use Topics   Alcohol use: No   Drug use: No     Allergies   Reglan [metoclopramide], Ativan [lorazepam], Codeine, Darvocet [propoxyphene n-acetaminophen], Fentanyl, Gabapentin, Hydrocodone, Nsaids, Pregabalin, Tramadol, and Vistaril [hydroxyzine]   Review of Systems Review of Systems ROS: Statement: All systems negative except as marked or noted in the HPI; Constitutional: Negative for fever and chills. ; ;  Eyes: Negative for eye pain, redness and discharge. ; ; ENMT: Negative for ear pain, hoarseness, nasal congestion, sinus pressure and sore throat. ; ; Cardiovascular: Negative for chest pain, palpitations, diaphoresis, dyspnea and peripheral edema. ; ; Respiratory: Negative for cough, wheezing and stridor. ; ; Gastrointestinal: +N/V, abd pain, constipation. Negative for diarrhea, blood in stool, hematemesis, jaundice and rectal bleeding. . ; ; Genitourinary: Negative for dysuria, flank  pain and hematuria. ; ; Genital:  No penile drainage or rash, no testicular pain or swelling, no scrotal rash or swelling. ;; Musculoskeletal: Negative for back pain and neck pain. Negative for swelling and trauma.; ; Skin: Negative for pruritus, rash, abrasions, blisters, bruising and skin lesion.; ; Neuro: Negative for headache, lightheadedness and neck stiffness. Negative for weakness, altered level of consciousness, altered mental status, extremity weakness, paresthesias, involuntary movement, seizure and syncope.       Physical Exam Updated Vital Signs Ht 5\' 2"  (1.575 m)    Wt 52.2 kg    BMI 21.03 kg/m   Physical Exam 1250: Physical examination:  Nursing notes reviewed; Vital signs and O2 SAT reviewed;  Constitutional: Well developed, Well nourished, Well hydrated, In no acute distress; Head:  Normocephalic, atraumatic; Eyes: EOMI, PERRL, No scleral icterus; ENMT: Mouth and pharynx normal, Mucous membranes moist; Neck: Supple, Full range of motion, No lymphadenopathy; Cardiovascular: Regular rate and rhythm, No gallop; Respiratory: Breath sounds clear & equal bilaterally, No wheezes.  Speaking full sentences with ease, Normal respiratory effort/excursion; Chest: Nontender, Movement normal; Abdomen: Soft, +diffuse tenderness to palp. No rebound or guarding. Nondistended, Normal bowel sounds; Genitourinary: No CVA tenderness; Extremities: Peripheral pulses normal, No tenderness, No edema, No calf edema or asymmetry.; Neuro: AA&Ox3, Major CN grossly intact.  Speech clear. No gross focal motor or sensory deficits in extremities.; Skin: Color normal, Warm, Dry.    ED Treatments / Results  Labs (all labs ordered are listed, but only abnormal results are displayed)   EKG None  Radiology   Procedures Procedures (including critical care time)  Medications Ordered in ED Medications  morphine 4 MG/ML injection 4 mg (4 mg Intravenous Given 10/30/18 1310)  famotidine (PEPCID) IVPB 20 mg premix  (20 mg Intravenous New Bag/Given 10/30/18 1310)     Initial Impression / Assessment and Plan / ED Course  I have reviewed the triage vital signs and the nursing notes.  Pertinent labs & imaging results that were available during my care of the patient were reviewed by me and considered in my medical decision making (see chart for details).     MDM Reviewed: previous chart, nursing note and vitals Reviewed previous: labs Interpretation: labs    Results for orders placed or performed during the hospital encounter of 10/30/18  Lipase, blood  Result Value Ref Range   Lipase 28 11 - 51 U/L  Comprehensive metabolic panel  Result Value Ref Range   Sodium 140 135 - 145 mmol/L   Potassium 3.8 3.5 - 5.1 mmol/L   Chloride 102 98 - 111 mmol/L   CO2 26 22 - 32 mmol/L   Glucose, Bld 133 (H) 70 - 99 mg/dL   BUN 12 6 - 20 mg/dL   Creatinine, Ser 0.70 0.61 - 1.24 mg/dL   Calcium 9.6 8.9 - 10.3 mg/dL   Total Protein 7.1 6.5 - 8.1 g/dL   Albumin 3.8 3.5 - 5.0 g/dL   AST 20 15 - 41 U/L   ALT 15 0 - 44 U/L   Alkaline Phosphatase 65 38 -  126 U/L   Total Bilirubin 0.7 0.3 - 1.2 mg/dL   GFR calc non Af Amer >60 >60 mL/min   GFR calc Af Amer >60 >60 mL/min   Anion gap 12 5 - 15  CBC  Result Value Ref Range   WBC 6.0 4.0 - 10.5 K/uL   RBC 5.31 4.22 - 5.81 MIL/uL   Hemoglobin 10.0 (L) 13.0 - 17.0 g/dL   HCT 36.2 (L) 39.0 - 52.0 %   MCV 68.2 (L) 80.0 - 100.0 fL   MCH 18.8 (L) 26.0 - 34.0 pg   MCHC 27.6 (L) 30.0 - 36.0 g/dL   RDW 19.4 (H) 11.5 - 15.5 %   Platelets 609 (H) 150 - 400 K/uL   nRBC 0.0 0.0 - 0.2 %  Urinalysis, Routine w reflex microscopic  Result Value Ref Range   Color, Urine YELLOW YELLOW   APPearance CLEAR CLEAR   Specific Gravity, Urine 1.013 1.005 - 1.030   pH 9.0 (H) 5.0 - 8.0   Glucose, UA NEGATIVE NEGATIVE mg/dL   Hgb urine dipstick NEGATIVE NEGATIVE   Bilirubin Urine NEGATIVE NEGATIVE   Ketones, ur 20 (A) NEGATIVE mg/dL   Protein, ur NEGATIVE NEGATIVE mg/dL    Nitrite NEGATIVE NEGATIVE   Leukocytes,Ua NEGATIVE NEGATIVE    Keaghan Staton was evaluated in Emergency Department on 10/30/2018 for the symptoms described in the history of present illness. He was evaluated in the context of the global COVID-19 pandemic, which necessitated consideration that the patient might be at risk for infection with the SARS-CoV-2 virus that causes COVID-19. Institutional protocols and algorithms that pertain to the evaluation of patients at risk for COVID-19 are in a state of rapid change based on information released by regulatory bodies including the CDC and federal and state organizations. These policies and algorithms were followed during the patient's care in the ED.   1505:  Labs per baseline. CT pending. Dispo based on results. Sign out to Dr. Lacinda Axon.     Final Clinical Impressions(s) / ED Diagnoses   Final diagnoses:  None    ED Discharge Orders    None       Francine Graven, DO 10/31/18 [id number]

## 2018-10-30 NOTE — ED Triage Notes (Signed)
Pt states he has hx of bowel obstruction and has not had a bowel movement in 5 days and has been vomiting with abd pain.

## 2018-10-30 NOTE — ED Provider Notes (Signed)
Discussed CT report with patient.  No acute abdomen at discharge.  He does not meet criteria for admission.  Discharge medication Percocet (#6 here to go and #10 Rxed to his pharmacy)   Nat Christen, MD 10/30/18 [phone]

## 2018-10-31 MED FILL — Oxycodone w/ Acetaminophen Tab 5-325 MG: ORAL | Qty: 6 | Status: AC

## 2018-11-09 ENCOUNTER — Emergency Department (HOSPITAL_COMMUNITY)
Admission: EM | Admit: 2018-11-09 | Discharge: 2018-11-09 | Disposition: A | Discharge status: Home / Self Care | Payer: Medicare Other | Attending: Emergency Medicine | Admitting: Emergency Medicine

## 2018-11-09 ENCOUNTER — Encounter (HOSPITAL_COMMUNITY): Payer: Self-pay | Admitting: Emergency Medicine

## 2018-11-09 ENCOUNTER — Other Ambulatory Visit: Payer: Self-pay

## 2018-11-09 ENCOUNTER — Emergency Department (HOSPITAL_COMMUNITY): Payer: Medicare Other

## 2018-11-09 DIAGNOSIS — M25561 Pain in right knee: Secondary | ICD-10-CM | POA: Diagnosis present

## 2018-11-09 DIAGNOSIS — Y999 Unspecified external cause status: Secondary | ICD-10-CM | POA: Diagnosis not present

## 2018-11-09 DIAGNOSIS — W228XXA Striking against or struck by other objects, initial encounter: Secondary | ICD-10-CM | POA: Diagnosis not present

## 2018-11-09 DIAGNOSIS — F17220 Nicotine dependence, chewing tobacco, uncomplicated: Secondary | ICD-10-CM | POA: Diagnosis not present

## 2018-11-09 DIAGNOSIS — Y939 Activity, unspecified: Secondary | ICD-10-CM | POA: Diagnosis not present

## 2018-11-09 DIAGNOSIS — Z79899 Other long term (current) drug therapy: Secondary | ICD-10-CM | POA: Insufficient documentation

## 2018-11-09 DIAGNOSIS — I10 Essential (primary) hypertension: Secondary | ICD-10-CM | POA: Diagnosis not present

## 2018-11-09 DIAGNOSIS — Y929 Unspecified place or not applicable: Secondary | ICD-10-CM | POA: Insufficient documentation

## 2018-11-09 MED ORDER — CYCLOBENZAPRINE HCL 10 MG PO TABS
10.0000 mg | ORAL_TABLET | Freq: Once | ORAL | Status: DC
  Filled 2018-11-09: qty 1

## 2018-11-09 MED ORDER — ACETAMINOPHEN 500 MG PO TABS
1000.0000 mg | ORAL_TABLET | Freq: Once | ORAL | Status: AC
  Administered 2018-11-09: 11:00:00 1000 mg via ORAL
  Filled 2018-11-09: qty 2

## 2018-11-09 NOTE — ED Triage Notes (Addendum)
Pt c/o RT knee pain since Friday. Pt states he hit it on a ladder. Pt ambulatory. Pt reports he took Tylenol at 0700 this morning.

## 2018-11-09 NOTE — ED Provider Notes (Signed)
[facility] EMERGENCY DEPARTMENT Provider Note   CSN: [id number] Arrival date & time: 11/09/18  0909     History   Chief Complaint Chief Complaint  Patient presents with  . Knee Pain    HPI Shawn Ayala is a 53 y.o. male.     Patient is a 53 year old male who presents to the emergency department with a complaint of right knee pain.  The patient states that this problem started 4 or 5 days ago.  He says he hit his knee on a ladder.  He has been ambulatory since the injury, but continues to have pain of the right knee.  Patient states he took Tylenol around 7:00 this morning, but he continues to have pain.  No recent operations or procedures involving the knee.  The patient denies being on any anticoagulation medications.  He presents now for evaluation concerning his knee.  The history is provided by the patient.  Knee Pain Associated symptoms: no back pain and no neck pain     Past Medical History:  Diagnosis Date  . Chronic back pain   . Diverticulitis   . Drug-seeking behavior    per 2017 Pain Management office note  . GERD (gastroesophageal reflux disease)   . Heart murmur   . Hiatal hernia   . Hypertension   . Migraines   . Narcotic abuse ([place])   . 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 Benign neoplasm of colon 06/14/2013  . Diverticulosis of colon (without mention of hemorrhage) 06/14/2013  . Substance abuse ([place]) 06/12/2013  . Acute esophagitis 06/12/2013  . Anemia, iron deficiency 06/11/2013  . Hypokalemia 06/11/2013  . Abdominal pain, chronic, epigastric 06/11/2013  . Gastroparesis 06/11/2013  . 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 Loss of weight 06/11/2013  . Hypertension 03/25/2011  . GERD (gastroesophageal reflux disease) 03/25/2011    Past Surgical History:  Procedure Laterality Date  . COLONOSCOPY N/A 06/14/2013   Procedure: COLONOSCOPY;  Surgeon: Gatha Mayer, MD;  Location: [place];   Service: Endoscopy;  Laterality: N/A;  . ESOPHAGOGASTRODUODENOSCOPY N/A 06/11/2013   Procedure: ESOPHAGOGASTRODUODENOSCOPY (EGD);  Surgeon: Ladene Artist, MD;  Location: [facility] ENDOSCOPY;  Service: Endoscopy;  Laterality: N/A;  . Left inguinal hernia repair          Home Medications    Prior to Admission medications   Medication Sig Start Date End Date Taking? Authorizing Provider  acetaminophen (TYLENOL) 325 MG tablet Take 650 mg by mouth every 6 (six) hours as needed for mild pain, fever or headache.    [provider]  calcium carbonate (TUMS - DOSED IN MG ELEMENTAL CALCIUM) 500 MG chewable tablet Chew 2-3 tablets by mouth 4 (four) times daily as needed for indigestion or heartburn.     [provider]  Multiple Vitamins-Minerals (SUPER THERA VITE M) TABS Take 1 tablet by mouth daily.    [provider]  oxyCODONE-acetaminophen (PERCOCET) 5-325 MG tablet Take 1 tablet by mouth every 4 (four) hours as needed. 10/30/18   Nat Christen, MD  oxyCODONE-acetaminophen (PERCOCET) 5-325 MG tablet Take 1 tablet by mouth every 4 (four) hours as needed. 10/30/18   Nat Christen, MD    Family History No family history on file.  Social History Social History   Tobacco Use  . Smoking status: Never Smoker  . Smokeless tobacco: Current User    Types: Chew  . Tobacco comment: Uses snuff since age 25yr  Substance Use Topics  . Alcohol use: No  . Drug use: No     Allergies   Reglan [metoclopramide], Ativan [lorazepam], Codeine, Darvocet [propoxyphene n-acetaminophen], Fentanyl, Gabapentin, Hydrocodone, Nsaids, Pregabalin, Tramadol, and Vistaril [hydroxyzine]   Review of Systems Review of Systems  Constitutional: Negative for activity change and appetite change.  HENT: Negative for congestion, ear discharge, ear pain, facial swelling, nosebleeds, rhinorrhea, sneezing and tinnitus.   Eyes: Negative for photophobia, pain and discharge.  Respiratory: Negative for cough,  choking, shortness of breath and wheezing.   Cardiovascular: Negative for chest pain, palpitations and leg swelling.  Gastrointestinal: Negative for abdominal pain, blood in stool, constipation, diarrhea, nausea and vomiting.  Genitourinary: Negative for difficulty urinating, dysuria, flank pain, frequency and hematuria.  Musculoskeletal: Positive for arthralgias. Negative for back pain, gait problem, myalgias and neck pain.  Skin: Negative for color change, rash and wound.  Neurological: Negative for dizziness, seizures, syncope, facial asymmetry, speech difficulty, weakness and numbness.  Hematological: Negative for adenopathy. Does not bruise/bleed easily.  Psychiatric/Behavioral: Negative for agitation, confusion, hallucinations, self-injury and suicidal ideas. The patient is not nervous/anxious.      Physical Exam Updated Vital Signs BP (!) 141/95 (BP Location: Right Arm)   Pulse 88   Temp 98.2 F (36.8 C) (Oral)   Resp 16   Ht 5\' 2"  (1.575 m)   Wt 52.2 kg   SpO2 100%   BMI 21.03 kg/m   Physical Exam Vitals signs and nursing note reviewed.  Constitutional:      Appearance: He is well-developed. He is not toxic-appearing.  HENT:     Head: Normocephalic.     Right Ear: Tympanic membrane and external ear normal.     Left Ear: Tympanic membrane and external ear normal.  Eyes:     General: Lids are normal.     Pupils: Pupils are equal, round, and reactive to light.  Neck:     Musculoskeletal: Normal range of motion and neck supple.     Vascular: No carotid bruit.  Cardiovascular:     Rate and Rhythm: Normal rate and regular rhythm.     Pulses: Normal pulses.     Heart sounds: Normal heart sounds.  Pulmonary:     Effort: No respiratory distress.     Breath sounds: Rhonchi present.  Abdominal:     General: Bowel sounds are normal.     Palpations: Abdomen is soft.     Tenderness: There is no abdominal tenderness. There is no guarding.  Musculoskeletal: Normal range of  motion.     Right knee: He exhibits no effusion. Tenderness found. Lateral joint line tenderness noted.     Comments: There is good range of motion of the right hip, ankle, and toes.  There is pain with flexion and extension of the knee.  There is no effusion.  No posterior mass appreciated. Neg Homan's sign.  There is no hot joint.  Dorsalis pedis pulses 2+.  Lymphadenopathy:     Head:     Right side of head: No submandibular adenopathy.     Left side of head: No submandibular adenopathy.     Cervical: No cervical adenopathy.  Skin:    General: Skin is warm and dry.  Neurological:     Mental Status: He is alert and oriented to person, place, and time.     Cranial Nerves: No cranial nerve deficit.     Sensory: No sensory deficit.  Psychiatric:        Speech: Speech  normal.      ED Treatments / Results  Labs (all labs ordered are listed, but only abnormal results are displayed) Labs Reviewed - No data to display  EKG None  Radiology Dg Knee Complete 4 Views Right  Result Date: 11/09/2018 CLINICAL DATA:  53 year old male with right knee pain and swelling. EXAM: RIGHT KNEE - COMPLETE 4+ VIEW COMPARISON:  Right knee radiograph dated 12/20/2016 FINDINGS: There is no acute fracture or dislocation. The bones are well mineralized. No significant arthritic changes. No joint effusion. The soft tissues are unremarkable. IMPRESSION: Negative. Electronically Signed   By: Anner Crete M.D.   On: 11/09/2018 09:59    Procedures Procedures (including critical care time)  Medications Ordered in ED Medications - No data to display   Initial Impression / Assessment and Plan / ED Course  I have reviewed the triage vital signs and the nursing notes.  Pertinent labs & imaging results that were available during my care of the patient were reviewed by me and considered in my medical decision making (see chart for details).          Final Clinical Impressions(s) / ED Diagnoses MDM   Vital signs reviewed.  Patient treated in the emergency department with Flexeril and Tylenol.  I discussed the x-rays with the patient in terms of which he understands.  I was informed that shortly after receiving the medication the patient left the emergency department and did not want to wait on his paperwork or signing the discharge instructions.  Patient left the emergency department [place].   Final diagnoses:  Right knee pain, unspecified chronicity    ED Discharge Orders    None       Lily Kocher, PA-C 11/09/18 1840    Fredia Sorrow, MD 11/19/18 1153

## 2018-11-09 NOTE — ED Notes (Addendum)
Pt left AMA. Did not want to wait on paperwork, MD or sign for discharge. MD notified

## 2018-11-09 NOTE — ED Notes (Signed)
Patient transported to X-ray 

## 2018-12-16 ENCOUNTER — Encounter (HOSPITAL_COMMUNITY): Payer: Self-pay | Admitting: Emergency Medicine

## 2018-12-16 ENCOUNTER — Other Ambulatory Visit: Payer: Self-pay

## 2018-12-16 ENCOUNTER — Emergency Department (HOSPITAL_COMMUNITY)
Admission: EM | Admit: 2018-12-16 | Discharge: 2018-12-16 | Disposition: A | Discharge status: Home / Self Care | Payer: Medicare Other | Attending: Emergency Medicine | Admitting: Emergency Medicine

## 2018-12-16 ENCOUNTER — Emergency Department (HOSPITAL_COMMUNITY)
Admission: EM | Admit: 2018-12-16 | Discharge: 2018-12-16 | Disposition: A | Discharge status: Home / Self Care | Payer: Medicare Other

## 2018-12-16 DIAGNOSIS — I10 Essential (primary) hypertension: Secondary | ICD-10-CM | POA: Insufficient documentation

## 2018-12-16 DIAGNOSIS — F17220 Nicotine dependence, chewing tobacco, uncomplicated: Secondary | ICD-10-CM | POA: Insufficient documentation

## 2018-12-16 DIAGNOSIS — Z79899 Other long term (current) drug therapy: Secondary | ICD-10-CM | POA: Diagnosis not present

## 2018-12-16 DIAGNOSIS — K0889 Other specified disorders of teeth and supporting structures: Secondary | ICD-10-CM

## 2018-12-16 MED ORDER — OXYCODONE-ACETAMINOPHEN 5-325 MG PO TABS: 1.0000 | ORAL_TABLET | ORAL | 0 refills | Status: AC | PRN

## 2018-12-16 MED ORDER — OXYCODONE-ACETAMINOPHEN 5-325 MG PO TABS
1.0000 | ORAL_TABLET | Freq: Once | ORAL | Status: AC
  Administered 2018-12-16: 22:00:00 1 via ORAL
  Filled 2018-12-16: qty 1

## 2018-12-16 NOTE — Discharge Instructions (Addendum)
I have prescribed a small amount of pain medication, this will need to last until you see your dentist on Thursday at 1130 at your schedule appointment that you reported to me today.  You may continue taking over-the-counter Tylenol, Orajel to help with your symptoms.

## 2018-12-16 NOTE — ED Triage Notes (Signed)
Pt called for triage 2 times with no answer. Pt assumed to have left prior to triage.

## 2018-12-16 NOTE — ED Provider Notes (Addendum)
[facility] EMERGENCY DEPARTMENT Provider Note   CSN: [id number] Arrival date & time: 12/16/18  2019     History   Chief Complaint Chief Complaint  Patient presents with  . Dental Pain    HPI Shawn Ayala is a 53 y.o. male.     53 year old male with a past medical history of GERD, upper kalemia, anemia presents to the ED with complaints of dental pain x1 week.  Patient was seen by Dr. Maryann Alar, has an appointment with him on Thursday for a retake of the x-ray of his mouth.  He is had a dental problem for the past 2 years however has never gone and addressed.  He has been taking amoxicillin which she was previously prescribed for a dental infection.  Has been taking Tylenol, ibuprofen, Orajel, multiple over-the-counter medications without improvement.  Patient reports the pain is intolerable.  Does have an appointment scheduled 1130 with Dr. Maryann Alar.  He denies any fever, pain with eye movement, dysphagia.  The history is provided by the patient and medical records.  Dental Pain Associated symptoms: no fever     Past Medical History:  Diagnosis Date  . Chronic back pain   . Diverticulitis   . Drug-seeking behavior    per 2017 Pain Management office note  . GERD (gastroesophageal reflux disease)   . Heart murmur   . Hiatal hernia   . Hypertension   . Migraines   . Narcotic abuse ([place])   . 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 Benign neoplasm of colon 06/14/2013  . Diverticulosis of colon (without mention of hemorrhage) 06/14/2013  . Substance abuse ([place]) 06/12/2013  . Acute esophagitis 06/12/2013  . Anemia, iron deficiency 06/11/2013  . Hypokalemia 06/11/2013  . Abdominal pain, chronic, epigastric 06/11/2013  . Gastroparesis 06/11/2013  . 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 Loss of weight 06/11/2013  . Hypertension 03/25/2011  . GERD (gastroesophageal reflux disease) 03/25/2011    Past Surgical History:   Procedure Laterality Date  . COLONOSCOPY N/A 06/14/2013   Procedure: COLONOSCOPY;  Surgeon: Gatha Mayer, MD;  Location: [place];  Service: Endoscopy;  Laterality: N/A;  . ESOPHAGOGASTRODUODENOSCOPY N/A 06/11/2013   Procedure: ESOPHAGOGASTRODUODENOSCOPY (EGD);  Surgeon: Ladene Artist, MD;  Location: [facility] ENDOSCOPY;  Service: Endoscopy;  Laterality: N/A;  . Left inguinal hernia repair          Home Medications    Prior to Admission medications   Medication Sig Start Date End Date Taking? Authorizing Provider  acetaminophen (TYLENOL) 325 MG tablet Take 650 mg by mouth every 6 (six) hours as needed for mild pain, fever or headache.    [provider]  calcium carbonate (TUMS - DOSED IN MG ELEMENTAL CALCIUM) 500 MG chewable tablet Chew 2-3 tablets by mouth 4 (four) times daily as needed for indigestion or heartburn.     [provider]  Multiple Vitamins-Minerals (SUPER THERA VITE M) TABS Take 1 tablet by mouth daily.    [provider]  oxyCODONE-acetaminophen (PERCOCET/ROXICET) 5-325 MG tablet Take 1 tablet by mouth every 4 (four) hours as needed for up to 3 days for severe pain. 12/16/18 12/19/18  Janeece Fitting, PA-C    Family History History reviewed. No pertinent family history.  Social History Social History   Tobacco Use  . Smoking status: Never Smoker  . Smokeless tobacco: Current User    Types: Chew  . Tobacco comment: Uses snuff since age 6yr  Substance Use Topics  . Alcohol use: No  . Drug use: No     Allergies   Reglan [metoclopramide], Ativan [lorazepam], Codeine, Darvocet [propoxyphene n-acetaminophen], Fentanyl, Gabapentin, Hydrocodone, Nsaids, Pregabalin, Tramadol, and Vistaril [hydroxyzine]   Review of Systems Review of Systems  Constitutional: Negative for fever.  HENT: Positive for dental problem.      Physical Exam Updated Vital Signs BP (!) 144/101 (BP Location: Left Arm)   Pulse 88   Temp 98 F (36.7 C) (Oral)    Resp 17   Ht 5\' 2"  (1.575 m)   Wt 57.6 kg Comment: pt has boots on  SpO2 98%   BMI 23.23 kg/m   Physical Exam Vitals signs and nursing note reviewed.  Constitutional:      Appearance: He is well-developed.  HENT:     Head: Normocephalic and atraumatic.     Mouth/Throat:     Comments: Poor dentition throughout his whole mouth, multiple cavities, multiple caries, seeing tooth to the bottom left.  Uvula appears midline, no tonsillar exudates. Eyes:     General: No scleral icterus.    Pupils: Pupils are equal, round, and reactive to light.  Neck:     Musculoskeletal: Normal range of motion.  Cardiovascular:     Heart sounds: Normal heart sounds.  Pulmonary:     Effort: Pulmonary effort is normal.     Breath sounds: Normal breath sounds. No wheezing.  Chest:     Chest wall: No tenderness.  Abdominal:     General: Bowel sounds are normal. There is no distension.     Palpations: Abdomen is soft.     Tenderness: There is no abdominal tenderness.  Musculoskeletal:        General: No tenderness or deformity.  Skin:    General: Skin is warm and dry.  Neurological:     Mental Status: He is alert and oriented to person, place, and time.      ED Treatments / Results  Labs (all labs ordered are listed, but only abnormal results are displayed) Labs Reviewed - No data to display  EKG None  Radiology No results found.  Procedures Procedures (including critical care time)  Medications Ordered in ED Medications  oxyCODONE-acetaminophen (PERCOCET/ROXICET) 5-325 MG per tablet 1 tablet (has no administration in time range)     Initial Impression / Assessment and Plan / ED Course  I have reviewed the triage vital signs and the nursing notes.  Pertinent labs & imaging results that were available during my care of the patient were reviewed by me and considered in my medical decision making (see chart for details).        Patient with extensive past medical history presents  to the ED with complaints of dental pain x1 week.  He reports the pain has been unbearable, he is unable to sleep, worse with mastication along with walking.  He thus have a prescription for amoxicillin and he is been taken from his previous dental abscess, this is a complaint I was supposed to be taking care of 2 years ago.  On my exam patient has very poor dentition throughout, has no pain with eye movement, has some tenderness to his jaw without any visual facial swelling.  He has been taking over-the-counter medication without improvement, he does report an appointment on Thursday with Dr. Maryann Alar, will provide him with pain medication while in the ED.  Patient does have a prior history of drug-seeking behavior, he does appear teary-eyed on exam, while  I understand he is in pain will provide him with a small amount of pain medication to get him to his appointment.  We have discussed the risks and benefits of obtaining a prescription for narcotics today.  Patient is agreeable of management, with stable vital signs.  Patient stable for discharge.   Portions of this note were generated with [employer]. Dictation errors may occur despite best attempts at proofreading.  Final Clinical Impressions(s) / ED Diagnoses   Final diagnoses:  Pain, dental    ED Discharge Orders         Ordered    oxyCODONE-acetaminophen (PERCOCET/ROXICET) 5-325 MG tablet  Every 4 hours PRN     12/16/18 2158           Janeece Fitting, PA-C 12/16/18 2159    Janeece Fitting, PA-C 12/16/18 2159    Milton Ferguson, MD 12/21/18 1026

## 2018-12-16 NOTE — ED Triage Notes (Addendum)
Pt reports dental pain (bottom, left back tooth) that started Sunday night. Pt has dentist appt next Thursday with Dr Maryann Alar. Pt has been taking amoxicillin left over from last year that he has been taking 3 times since Monday morning.

## 2018-12-18 ENCOUNTER — Other Ambulatory Visit: Payer: Self-pay

## 2018-12-18 ENCOUNTER — Encounter (HOSPITAL_COMMUNITY): Payer: Self-pay | Admitting: Emergency Medicine

## 2018-12-18 ENCOUNTER — Emergency Department (HOSPITAL_COMMUNITY)
Admission: EM | Admit: 2018-12-18 | Discharge: 2018-12-18 | Disposition: A | Discharge status: Home / Self Care | Payer: Medicare Other | Attending: Emergency Medicine | Admitting: Emergency Medicine

## 2018-12-18 DIAGNOSIS — Z885 Allergy status to narcotic agent status: Secondary | ICD-10-CM | POA: Insufficient documentation

## 2018-12-18 DIAGNOSIS — I10 Essential (primary) hypertension: Secondary | ICD-10-CM | POA: Insufficient documentation

## 2018-12-18 DIAGNOSIS — R011 Cardiac murmur, unspecified: Secondary | ICD-10-CM | POA: Diagnosis not present

## 2018-12-18 DIAGNOSIS — K029 Dental caries, unspecified: Secondary | ICD-10-CM | POA: Diagnosis not present

## 2018-12-18 DIAGNOSIS — Z888 Allergy status to other drugs, medicaments and biological substances status: Secondary | ICD-10-CM | POA: Insufficient documentation

## 2018-12-18 DIAGNOSIS — Z886 Allergy status to analgesic agent status: Secondary | ICD-10-CM | POA: Diagnosis not present

## 2018-12-18 DIAGNOSIS — K0889 Other specified disorders of teeth and supporting structures: Secondary | ICD-10-CM

## 2018-12-18 MED ORDER — HYDROXYZINE HCL 25 MG PO TABS
25.0000 mg | ORAL_TABLET | Freq: Once | ORAL | Status: DC
  Filled 2018-12-18: qty 1

## 2018-12-18 MED ORDER — OXYCODONE-ACETAMINOPHEN 5-325 MG PO TABS
2.0000 | ORAL_TABLET | Freq: Once | ORAL | Status: AC
  Administered 2018-12-18: 19:00:00 2 via ORAL
  Filled 2018-12-18: qty 2

## 2018-12-18 MED ORDER — AMOXICILLIN-POT CLAVULANATE 875-125 MG PO TABS
1.0000 | ORAL_TABLET | Freq: Once | ORAL | Status: AC
  Administered 2018-12-18: 1 via ORAL
  Filled 2018-12-18: qty 1

## 2018-12-18 NOTE — Discharge Instructions (Signed)
Your blood pressure is elevated at 155/104, otherwise your vital signs are within normal limits.  You have mild swelling of the gum at the affected tooth.  There is no evidence for dental/surgical emergency at this time.  Please continue your antibiotic.  Please see your dentist on Tuesday as scheduled.  You were treated with narcotic pain medication and with anti-nausea medicine tonight.  Please use caution getting around as both of these medicines can cause drowsiness, and/or lightheadedness.

## 2018-12-18 NOTE — ED Triage Notes (Signed)
Pt reports left bottom tooth pain with dental appt Thursday.

## 2018-12-18 NOTE — ED Provider Notes (Signed)
[facility] EMERGENCY DEPARTMENT Provider Note   CSN: [id number] Arrival date & time: 12/18/18  1635     History   Chief Complaint Chief Complaint  Patient presents with  . Dental Pain    HPI Shawn Ayala is a 53 y.o. male.     The history is provided by the patient.  Dental Pain Location:  Lower Lower teeth location:  22/LL cuspid Quality:  Throbbing Severity:  Moderate Onset quality:  Gradual Timing:  Constant Progression:  Worsening Chronicity:  Chronic Context: dental caries and poor dentition   Relieved by:  Nothing Worsened by:  Nothing Ineffective treatments:  Topical anesthetic gel and acetaminophen Associated symptoms: facial pain and gum swelling   Associated symptoms: no congestion, no difficulty swallowing, no drooling, no facial swelling, no fever and no neck pain   Risk factors: chewing tobacco use and lack of dental care   Risk factors: no alcohol problem and no diabetes     Past Medical History:  Diagnosis Date  . Chronic back pain   . Diverticulitis   . Drug-seeking behavior    per 2017 Pain Management office note  . GERD (gastroesophageal reflux disease)   . Heart murmur   . Hiatal hernia   . Hypertension   . Migraines   . Narcotic abuse ([place])   . 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 Benign neoplasm of colon 06/14/2013  . Diverticulosis of colon (without mention of hemorrhage) 06/14/2013  . Substance abuse ([place]) 06/12/2013  . Acute esophagitis 06/12/2013  . Anemia, iron deficiency 06/11/2013  . Hypokalemia 06/11/2013  . Abdominal pain, chronic, epigastric 06/11/2013  . Gastroparesis 06/11/2013  . 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 Loss of weight 06/11/2013  . Hypertension 03/25/2011  . GERD (gastroesophageal reflux disease) 03/25/2011    Past Surgical History:  Procedure Laterality Date  . COLONOSCOPY N/A 06/14/2013   Procedure: COLONOSCOPY;  Surgeon: Gatha Mayer,  MD;  Location: [place];  Service: Endoscopy;  Laterality: N/A;  . ESOPHAGOGASTRODUODENOSCOPY N/A 06/11/2013   Procedure: ESOPHAGOGASTRODUODENOSCOPY (EGD);  Surgeon: Ladene Artist, MD;  Location: [facility] ENDOSCOPY;  Service: Endoscopy;  Laterality: N/A;  . Left inguinal hernia repair          Home Medications    Prior to Admission medications   Medication Sig Start Date End Date Taking? Authorizing Provider  acetaminophen (TYLENOL) 325 MG tablet Take 650 mg by mouth every 6 (six) hours as needed for mild pain, fever or headache.    [provider]  calcium carbonate (TUMS - DOSED IN MG ELEMENTAL CALCIUM) 500 MG chewable tablet Chew 2-3 tablets by mouth 4 (four) times daily as needed for indigestion or heartburn.     [provider]  Multiple Vitamins-Minerals (SUPER THERA VITE M) TABS Take 1 tablet by mouth daily.    [provider]  oxyCODONE-acetaminophen (PERCOCET/ROXICET) 5-325 MG tablet Take 1 tablet by mouth every 4 (four) hours as needed for up to 3 days for severe pain. 12/16/18 12/19/18  Janeece Fitting, PA-C    Family History History reviewed. No pertinent family history.  Social History Social History   Tobacco Use  . Smoking status: Never Smoker  . Smokeless tobacco: Current User    Types: Chew  . Tobacco comment: Uses snuff since age 18yr  Substance Use Topics  . Alcohol use: No  . Drug use: No     Allergies   Reglan [metoclopramide], Ativan [lorazepam],  Codeine, Darvocet [propoxyphene n-acetaminophen], Fentanyl, Gabapentin, Hydrocodone, Nsaids, Pregabalin, Tramadol, and Vistaril [hydroxyzine]   Review of Systems Review of Systems  Constitutional: Negative for activity change, appetite change and fever.  HENT: Negative for congestion, drooling, ear discharge, ear pain, facial swelling, nosebleeds, rhinorrhea, sneezing and tinnitus.   Eyes: Negative for photophobia, pain and discharge.  Respiratory: Negative for cough, choking, shortness  of breath and wheezing.   Cardiovascular: Negative for chest pain, palpitations and leg swelling.  Gastrointestinal: Negative for abdominal pain, blood in stool, constipation, diarrhea, nausea and vomiting.  Genitourinary: Negative for difficulty urinating, dysuria, flank pain, frequency and hematuria.  Musculoskeletal: Negative for back pain, gait problem, myalgias and neck pain.  Skin: Negative for color change, rash and wound.  Neurological: Negative for dizziness, seizures, syncope, facial asymmetry, speech difficulty, weakness and numbness.  Hematological: Negative for adenopathy. Does not bruise/bleed easily.  Psychiatric/Behavioral: Negative for agitation, confusion, hallucinations, self-injury and suicidal ideas. The patient is not nervous/anxious.      Physical Exam Updated Vital Signs BP (!) 155/104 (BP Location: Right Arm)   Pulse 80   Temp 98.3 F (36.8 C) (Oral)   Resp 18   Ht 5\' 2"  (1.575 m)   Wt 54.4 kg   SpO2 100%   BMI 21.95 kg/m   Physical Exam Vitals signs and nursing note reviewed.  Constitutional:      Appearance: He is well-developed. He is not toxic-appearing.  HENT:     Head: Normocephalic.     Right Ear: Tympanic membrane and external ear normal.     Left Ear: Tympanic membrane and external ear normal.  Eyes:     General: Lids are normal.     Pupils: Pupils are equal, round, and reactive to light.  Neck:     Musculoskeletal: Normal range of motion and neck supple.     Vascular: No carotid bruit.  Cardiovascular:     Rate and Rhythm: Normal rate and regular rhythm.     Pulses: Normal pulses.     Heart sounds: Normal heart sounds.  Pulmonary:     Effort: No respiratory distress.     Breath sounds: Normal breath sounds.  Abdominal:     General: Bowel sounds are normal.     Palpations: Abdomen is soft.     Tenderness: There is no abdominal tenderness. There is no guarding.  Musculoskeletal: Normal range of motion.  Lymphadenopathy:     Head:      Right side of head: No submandibular adenopathy.     Left side of head: No submandibular adenopathy.     Cervical: No cervical adenopathy.  Skin:    General: Skin is warm and dry.  Neurological:     Mental Status: He is alert and oriented to person, place, and time.     Cranial Nerves: No cranial nerve deficit.     Sensory: No sensory deficit.  Psychiatric:        Speech: Speech normal.      ED Treatments / Results  Labs (all labs ordered are listed, but only abnormal results are displayed) Labs Reviewed - No data to display  EKG None  Radiology No results found.  Procedures Procedures (including critical care time)  Medications Ordered in ED Medications - No data to display   Initial Impression / Assessment and Plan / ED Course  I have reviewed the triage vital signs and the nursing notes.  Pertinent labs & imaging results that were available during my care of the patient  were reviewed by me and considered in my medical decision making (see chart for details).         Final Clinical Impressions(s) / ED Diagnoses  MDM  Blood pressure is elevated at 155/104, I have asked the patient have this rechecked soon.  Vital signs are otherwise within normal limits.  Patient has a dental carry at the left lower jaw area with mild increased swelling around the tooth.  No visible abscess.  No swelling under the tongue, or sign of Ludewig's angina or other emergent changes.  Patient is scheduled to see the dentist in 48 hours.  Patient was treated with Percocet here in the emergency department.  I have asked the patient to continue his current medications and to keep his dental appointment as scheduled.  Patient is advised to return to the emergency department if there is fever, chills, facial swelling, nausea, vomiting, or change in his general condition.     Final diagnoses:  Dental caries  Pain, dental    ED Discharge Orders    None       Lily Kocher, PA-C  12/18/18 1901    Milton Ferguson, MD 12/21/18 1026

## 2019-01-06 ENCOUNTER — Other Ambulatory Visit: Payer: Self-pay

## 2019-01-06 ENCOUNTER — Encounter (HOSPITAL_COMMUNITY): Payer: Self-pay | Admitting: Emergency Medicine

## 2019-01-06 ENCOUNTER — Emergency Department (HOSPITAL_COMMUNITY)
Admission: EM | Admit: 2019-01-06 | Discharge: 2019-01-06 | Disposition: A | Discharge status: Home / Self Care | Payer: Medicare Other | Attending: Emergency Medicine | Admitting: Emergency Medicine

## 2019-01-06 DIAGNOSIS — K0889 Other specified disorders of teeth and supporting structures: Secondary | ICD-10-CM | POA: Diagnosis present

## 2019-01-06 DIAGNOSIS — I10 Essential (primary) hypertension: Secondary | ICD-10-CM | POA: Insufficient documentation

## 2019-01-06 MED ORDER — CLINDAMYCIN HCL 150 MG PO CAPS
300.0000 mg | ORAL_CAPSULE | Freq: Once | ORAL | Status: AC
  Administered 2019-01-06: 21:00:00 300 mg via ORAL
  Filled 2019-01-06: qty 2

## 2019-01-06 MED ORDER — CLINDAMYCIN HCL 300 MG PO CAPS: 300.0000 mg | ORAL_CAPSULE | Freq: Three times a day (TID) | ORAL | 0 refills | Status: AC

## 2019-01-06 MED ORDER — KETOROLAC TROMETHAMINE 60 MG/2ML IM SOLN
60.0000 mg | Freq: Once | INTRAMUSCULAR | Status: AC
  Administered 2019-01-06: 21:00:00 60 mg via INTRAMUSCULAR
  Filled 2019-01-06: qty 2

## 2019-01-06 NOTE — ED Provider Notes (Addendum)
[facility] EMERGENCY DEPARTMENT Provider Note   CSN: [id number] Arrival date & time: 01/06/19  1747     History   Chief Complaint Chief Complaint  Patient presents with  . Dental Pain    HPI Shawn Ayala is a 53 y.o. male.     The history is provided by the patient. No language interpreter was used.  Dental Pain Location:  Lower Quality:  Aching Severity:  Moderate Timing:  Constant Progression:  Worsening Chronicity:  New Relieved by:  Nothing Worsened by:  Nothing Associated symptoms: gum swelling   Pt request percocet for pain.    Past Medical History:  Diagnosis Date  . Chronic back pain   . Diverticulitis   . Drug-seeking behavior    per 2017 Pain Management office note  . GERD (gastroesophageal reflux disease)   . Heart murmur   . Hiatal hernia   . Hypertension   . Migraines   . Narcotic abuse ([place])   . 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 Benign neoplasm of colon 06/14/2013  . Diverticulosis of colon (without mention of hemorrhage) 06/14/2013  . Substance abuse ([place]) 06/12/2013  . Acute esophagitis 06/12/2013  . Anemia, iron deficiency 06/11/2013  . Hypokalemia 06/11/2013  . Abdominal pain, chronic, epigastric 06/11/2013  . Gastroparesis 06/11/2013  . 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 Loss of weight 06/11/2013  . Hypertension 03/25/2011  . GERD (gastroesophageal reflux disease) 03/25/2011    Past Surgical History:  Procedure Laterality Date  . COLONOSCOPY N/A 06/14/2013   Procedure: COLONOSCOPY;  Surgeon: Gatha Mayer, MD;  Location: [place];  Service: Endoscopy;  Laterality: N/A;  . ESOPHAGOGASTRODUODENOSCOPY N/A 06/11/2013   Procedure: ESOPHAGOGASTRODUODENOSCOPY (EGD);  Surgeon: Ladene Artist, MD;  Location: [facility] ENDOSCOPY;  Service: Endoscopy;  Laterality: N/A;  . Left inguinal hernia repair          Home Medications    Prior to Admission medications   Medication  Sig Start Date End Date Taking? Authorizing Provider  acetaminophen (TYLENOL) 325 MG tablet Take 650 mg by mouth every 6 (six) hours as needed for mild pain, fever or headache.    [provider]  calcium carbonate (TUMS - DOSED IN MG ELEMENTAL CALCIUM) 500 MG chewable tablet Chew 2-3 tablets by mouth 4 (four) times daily as needed for indigestion or heartburn.     [provider]  Multiple Vitamins-Minerals (SUPER THERA VITE M) TABS Take 1 tablet by mouth daily.    [provider]    Family History History reviewed. No pertinent family history.  Social History Social History   Tobacco Use  . Smoking status: Never Smoker  . Smokeless tobacco: Current User    Types: Chew  . Tobacco comment: Uses snuff since age 89yr  Substance Use Topics  . Alcohol use: No  . Drug use: No     Allergies   Reglan [metoclopramide], Ativan [lorazepam], Codeine, Darvocet [propoxyphene n-acetaminophen], Fentanyl, Gabapentin, Hydrocodone, Nsaids, Pregabalin, Tramadol, and Vistaril [hydroxyzine]   Review of Systems Review of Systems  All other systems reviewed and are negative.    Physical Exam Updated Vital Signs BP (!) 164/99 (BP Location: Left Arm)   Pulse 80   Temp 98.3 F (36.8 C) (Oral)   Resp 18   Ht 5\' 2"  (1.575 m)   Wt 56.7 kg   SpO2 100%   BMI 22.86 kg/m   Physical Exam Vitals signs and nursing note reviewed.  Constitutional:      Appearance: He is well-developed.  HENT:     Head: Normocephalic and atraumatic.     Mouth/Throat:     Comments: Severe dental decay, broken right lower canine  Eyes:     Conjunctiva/sclera: Conjunctivae normal.  Neck:     Musculoskeletal: Neck supple.  Cardiovascular:     Rate and Rhythm: Normal rate and regular rhythm.     Heart sounds: No murmur.  Pulmonary:     Effort: Pulmonary effort is normal. No respiratory distress.     Breath sounds: Normal breath sounds.  Abdominal:     Tenderness: There is no abdominal  tenderness.  Skin:    General: Skin is warm and dry.  Neurological:     Mental Status: He is alert.      ED Treatments / Results  Labs (all labs ordered are listed, but only abnormal results are displayed) Labs Reviewed - No data to display  EKG None  Radiology No results found.  Procedures Procedures (including critical care time)  Medications Ordered in ED Medications  clindamycin (CLEOCIN) capsule 300 mg (has no administration in time range)     Initial Impression / Assessment and Plan / ED Course  I have reviewed the triage vital signs and the nursing notes.  Pertinent labs & imaging results that were available during my care of the patient were reviewed by me and considered in my medical decision making (see chart for details).        MDM  Pt has a history of narcotic abuse. Old chart reviewed. PMP reviewed.  Pt given rx for clindamycin.   Pt advised he needs to see a dentist.   Pt advised I will not give him Percocet.  Pt cursed at me and went into lobby and yelled at other pt's     Final diagnoses:  Toothache    ED Discharge Orders         Ordered    clindamycin (CLEOCIN) 300 MG capsule  3 times daily     01/06/19 2035        An After Visit Summary was printed and given to the patient.    Fransico Meadow, PA-C 01/06/19 2037    Fransico Meadow, PA-C 01/06/19 2100    Fransico Meadow, PA-C 01/06/19 2109    Fredia Sorrow, MD 01/17/19 [phone]

## 2019-01-06 NOTE — ED Triage Notes (Signed)
PT c/o lower left sided dental pain and a piece of the tooth broke off today causing more pain unrelieved by antibiotics prescribed to him he takes 4 times a day.

## 2019-01-06 NOTE — ED Notes (Signed)
PA and RN to PT room to discuss d/c papers. Pt is upset that Narcotics are not given to patient. Pt left yelling without d/c papers. Yelling in lobby "yall are at the wrong hospital"

## 2019-01-06 NOTE — Discharge Instructions (Signed)
See the dentist for evaluation

## 2019-05-19 ENCOUNTER — Encounter (HOSPITAL_COMMUNITY): Payer: Self-pay | Admitting: *Deleted

## 2019-05-19 ENCOUNTER — Other Ambulatory Visit: Payer: Self-pay

## 2019-05-19 ENCOUNTER — Emergency Department (HOSPITAL_COMMUNITY)
Admission: EM | Admit: 2019-05-19 | Discharge: 2019-05-19 | Disposition: A | Discharge status: Home / Self Care | Payer: Medicare Other | Attending: Emergency Medicine | Admitting: Emergency Medicine

## 2019-05-19 DIAGNOSIS — Z79899 Other long term (current) drug therapy: Secondary | ICD-10-CM | POA: Diagnosis not present

## 2019-05-19 DIAGNOSIS — L02415 Cutaneous abscess of right lower limb: Secondary | ICD-10-CM

## 2019-05-19 DIAGNOSIS — L02412 Cutaneous abscess of left axilla: Secondary | ICD-10-CM | POA: Diagnosis not present

## 2019-05-19 DIAGNOSIS — I10 Essential (primary) hypertension: Secondary | ICD-10-CM | POA: Diagnosis not present

## 2019-05-19 DIAGNOSIS — R2241 Localized swelling, mass and lump, right lower limb: Secondary | ICD-10-CM | POA: Diagnosis present

## 2019-05-19 MED ORDER — SULFAMETHOXAZOLE-TRIMETHOPRIM 800-160 MG PO TABS: 1.0000 | ORAL_TABLET | Freq: Two times a day (BID) | ORAL | 0 refills | Status: AC

## 2019-05-19 MED ORDER — LIDOCAINE HCL (PF) 2 % IJ SOLN: 10.0000 mL | Freq: Once | INTRAMUSCULAR | Status: DC

## 2019-05-19 MED ORDER — LIDOCAINE HCL (PF) 2 % IJ SOLN
INTRAMUSCULAR | Status: AC
  Filled 2019-05-19: qty 20

## 2019-05-19 MED ORDER — OXYCODONE-ACETAMINOPHEN 5-325 MG PO TABS
1.0000 | ORAL_TABLET | Freq: Once | ORAL | Status: AC
  Administered 2019-05-19: 1 via ORAL
  Filled 2019-05-19: qty 1

## 2019-05-19 MED ORDER — SULFAMETHOXAZOLE-TRIMETHOPRIM 800-160 MG PO TABS
1.0000 | ORAL_TABLET | Freq: Once | ORAL | Status: AC
  Administered 2019-05-19: 1 via ORAL
  Filled 2019-05-19: qty 1

## 2019-05-19 NOTE — Discharge Instructions (Signed)
Take antibiotics as prescribed. Take the entire course, even if your symptoms improve.  Take ibuprofen 3 times a day with meals.  Do not take other anti-inflammatories at the same time (Advil, Motrin, naproxen, Aleve). You may supplement with Tylenol if you need further pain control. Wash daily with soap and water.  Use warm compresses 3 times a day on the lesions for help with inflammation.  Return to the ER if you develop fevers, worsening drainage, or any new, worsening, or concerning symptoms.

## 2019-05-19 NOTE — ED Provider Notes (Signed)
[facility] EMERGENCY DEPARTMENT Provider Note   CSN: [id number] Arrival date & time: 05/19/19  1153     History Chief Complaint  Patient presents with  . Abscess    Shawn Ayala is a 54 y.o. male presenting for evaluation of multiple abscesses.  Patient states the past 2 weeks, he has developed multiple different abscesses.  Initial abscesses were in the right axilla.  He drained these when they first appeared about about 2 weeks ago.  Reports continued mild discomfort, but they are not as swollen as they were.  Then developed an abscess of distal right thigh.  This drained spontaneously, minimal swelling and discomfort at this time. Next abscess developed on the proximal R thigh, pt popped it last night and had green d/c.  Final abscess developed 2 days ago in his L axially. This is the largest and most painful, has not drained. He has not taken anything for this.  No fevers or chills.  No nausea or vomiting.  No history of similar abscesses.  HPI     Past Medical History:  Diagnosis Date  . Chronic back pain   . Diverticulitis   . Drug-seeking behavior    per 2017 Pain Management office note  . GERD (gastroesophageal reflux disease)   . Heart murmur   . Hiatal hernia   . Hypertension   . Migraines   . Narcotic abuse ([place])   . 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 Benign neoplasm of colon 06/14/2013  . Diverticulosis of colon (without mention of hemorrhage) 06/14/2013  . Substance abuse ([place]) 06/12/2013  . Acute esophagitis 06/12/2013  . Anemia, iron deficiency 06/11/2013  . Hypokalemia 06/11/2013  . Abdominal pain, chronic, epigastric 06/11/2013  . Gastroparesis 06/11/2013  . 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 Loss of weight 06/11/2013  . Hypertension 03/25/2011  . GERD (gastroesophageal reflux disease) 03/25/2011    Past Surgical History:  Procedure Laterality Date  . COLONOSCOPY N/A 06/14/2013   Procedure: COLONOSCOPY;  Surgeon: Gatha Mayer, MD;  Location: [place];  Service: Endoscopy;  Laterality: N/A;  . ESOPHAGOGASTRODUODENOSCOPY N/A 06/11/2013   Procedure: ESOPHAGOGASTRODUODENOSCOPY (EGD);  Surgeon: Ladene Artist, MD;  Location: [facility] ENDOSCOPY;  Service: Endoscopy;  Laterality: N/A;  . Left inguinal hernia repair         History reviewed. No pertinent family history.  Social History   Tobacco Use  . Smoking status: Never Smoker  . Smokeless tobacco: Current User    Types: Chew  . Tobacco comment: Uses snuff since age 75yr  Substance Use Topics  . Alcohol use: No  . Drug use: No    Home Medications Prior to Admission medications   Medication Sig Start Date End Date Taking? Authorizing Provider  acetaminophen (TYLENOL) 325 MG tablet Take 650 mg by mouth every 6 (six) hours as needed for mild pain, fever or headache.    [provider]  calcium carbonate (TUMS - DOSED IN MG ELEMENTAL CALCIUM) 500 MG chewable tablet Chew 2-3 tablets by mouth 4 (four) times daily as needed for indigestion or heartburn.     [provider]  Multiple Vitamins-Minerals (SUPER THERA VITE M) TABS Take 1 tablet by mouth daily.    [provider]  sulfamethoxazole-trimethoprim (BACTRIM DS) 800-160 MG tablet Take 1 tablet by mouth 2 (two) times daily for 7 days. 05/19/19 05/26/19  Chrishawn Boley, PA-C    Allergies    Reglan [metoclopramide], Ativan [  lorazepam], Codeine, Darvocet [propoxyphene n-acetaminophen], Fentanyl, Gabapentin, Hydrocodone, Nsaids, Pregabalin, Tramadol, and Vistaril [hydroxyzine]  Review of Systems   Review of Systems  Constitutional: Negative for fever.  Skin:       Multiple abscesses      Physical Exam Updated Vital Signs BP (!) 142/98 (BP Location: Right Arm)   Pulse 98   Temp 97.9 F (36.6 C) (Oral)   Resp 16   Ht 5\' 2"  (1.575 m)   Wt 54.4 kg   SpO2 100%   BMI 21.95 kg/m   Physical Exam Vitals and nursing note reviewed.    Constitutional:      General: He is not in acute distress.    Appearance: He is well-developed.     Comments: Appears nontoxic  HENT:     Head: Normocephalic and atraumatic.  Pulmonary:     Effort: Pulmonary effort is normal.  Abdominal:     General: There is no distension.  Musculoskeletal:        General: Normal range of motion.     Cervical back: Normal range of motion.  Skin:    General: Skin is warm.     Capillary Refill: Capillary refill takes less than 2 seconds.     Findings: No rash.     Comments: Multiple abscesses in various stages of healing.  Lesions in the right axilla without obvious fluctuance.  Very small.  Minimally tender. Large fluctuant and tender lesion in the left axilla consistent with drainable abscess. Approximately 1 cm area of fluctuance of the proximal right thigh without active drainage consistent with abscess.  Neurological:     Mental Status: He is alert and oriented to person, place, and time.     ED Results / Procedures / Treatments   Labs (all labs ordered are listed, but only abnormal results are displayed) Labs Reviewed - No data to display  EKG None  Radiology No results found.  Procedures .Marland KitchenIncision and Drainage  Date/Time: 05/19/2019 4:44 PM Performed by: Franchot Heidelberg, PA-C Authorized by: Franchot Heidelberg, PA-C   Consent:    Consent obtained:  Verbal   Consent given by:  Patient   Risks discussed:  Bleeding, incomplete drainage, pain, infection and damage to other organs   Alternatives discussed:  Alternative treatment Location:    Type:  Abscess   Location:  Upper extremity Pre-procedure details:    Skin preparation:  Chloraprep Anesthesia (see MAR for exact dosages):    Anesthesia method:  Local infiltration   Local anesthetic:  Lidocaine 1% w/o epi Procedure type:    Complexity:  Simple Procedure details:    Incision types:  Single straight   Incision depth:  Dermal   Scalpel blade:  11   Wound management:   Probed and deloculated   Drainage:  Purulent and bloody   Drainage amount:  Moderate   Wound treatment:  Wound left open   Packing materials:  None Post-procedure details:    Patient tolerance of procedure:  Tolerated well, no immediate complications   (including critical care time)  Medications Ordered in ED Medications  lidocaine (XYLOCAINE) 2 % injection 10 mL (10 mLs Intradermal Handoff 05/19/19 1318)  oxyCODONE-acetaminophen (PERCOCET/ROXICET) 5-325 MG per tablet 1 tablet (1 tablet Oral Given 05/19/19 1323)  sulfamethoxazole-trimethoprim (BACTRIM DS) 800-160 MG per tablet 1 tablet (1 tablet Oral Given 05/19/19 1323)    ED Course  I have reviewed the triage vital signs and the nursing notes.  Pertinent labs & imaging results that were available during my  care of the patient were reviewed by me and considered in my medical decision making (see chart for details).    MDM Rules/Calculators/A&P                      Presented for evaluation of multiple abscesses.  Physical exam shows patient who appears nontoxic.  He has 2 drainable abscesses, one in the left axilla along the right anterior thigh.    I&D performed of left axilla as described above.  Minimal purulence expressed.  Patient elected not to get I&D of right thigh.  Discussed importance of antibiotic treatment.  Discussed symptomatic treatment with warm compresses and NSAIDs.  At this time, patient appears safe for a discharge.  Return precautions given.  Patient states he understands and agrees to plan.  Final Clinical Impression(s) / ED Diagnoses Final diagnoses:  Abscess of left axilla  Abscess of right thigh    Rx / DC Orders ED Discharge Orders         Ordered    sulfamethoxazole-trimethoprim (BACTRIM DS) 800-160 MG tablet  2 times daily     05/19/19 1307           Genessis Flanary, PA-C 05/19/19 1646    Daleen Bo, MD 05/22/19 [phone]

## 2019-05-19 NOTE — ED Notes (Signed)
Pt with hx of abscesses   Has multiple abscesses with the worst to his R axilla, 2 small abscess to his L axilla Several to his R upper thigh   Reports he has not seen a physician because he has no physician   Also denies hx of MRSA

## 2019-05-19 NOTE — ED Triage Notes (Signed)
Pt with multiple abscesses all over, largest one to left axillary.  Couple of to right axillary, two to right thigh.  Pt states they are draining. Pt denies fever or N/V

## 2019-12-29 ENCOUNTER — Other Ambulatory Visit: Payer: Self-pay

## 2019-12-29 ENCOUNTER — Encounter (HOSPITAL_COMMUNITY): Payer: Self-pay

## 2019-12-29 ENCOUNTER — Emergency Department (HOSPITAL_COMMUNITY)
Admission: EM | Admit: 2019-12-29 | Discharge: 2019-12-29 | Disposition: A | Discharge status: Home / Self Care | Payer: Medicare Other | Attending: Emergency Medicine | Admitting: Emergency Medicine

## 2019-12-29 DIAGNOSIS — Z85038 Personal history of other malignant neoplasm of large intestine: Secondary | ICD-10-CM | POA: Insufficient documentation

## 2019-12-29 DIAGNOSIS — M79674 Pain in right toe(s): Secondary | ICD-10-CM | POA: Diagnosis present

## 2019-12-29 DIAGNOSIS — I10 Essential (primary) hypertension: Secondary | ICD-10-CM | POA: Insufficient documentation

## 2019-12-29 DIAGNOSIS — L600 Ingrowing nail: Secondary | ICD-10-CM

## 2019-12-29 MED ORDER — DOXYCYCLINE HYCLATE 100 MG PO TABS
100.0000 mg | ORAL_TABLET | Freq: Once | ORAL | Status: AC
  Administered 2019-12-29: 100 mg via ORAL
  Filled 2019-12-29: qty 1

## 2019-12-29 MED ORDER — OXYCODONE-ACETAMINOPHEN 5-325 MG PO TABS: 1.0000 | ORAL_TABLET | Freq: Three times a day (TID) | ORAL | 0 refills | Status: DC | PRN

## 2019-12-29 MED ORDER — POVIDONE-IODINE 10 % EX SOLN
CUTANEOUS | Status: AC
  Filled 2019-12-29: qty 15

## 2019-12-29 MED ORDER — LIDOCAINE HCL (PF) 1 % IJ SOLN
30.0000 mL | Freq: Once | INTRAMUSCULAR | Status: AC
  Administered 2019-12-29: 30 mL
  Filled 2019-12-29: qty 30

## 2019-12-29 MED ORDER — OXYCODONE-ACETAMINOPHEN 5-325 MG PO TABS
1.0000 | ORAL_TABLET | Freq: Once | ORAL | Status: AC
  Administered 2019-12-29: 1 via ORAL
  Filled 2019-12-29: qty 1

## 2019-12-29 MED ORDER — DOXYCYCLINE HYCLATE 100 MG PO CAPS: 100.0000 mg | ORAL_CAPSULE | Freq: Two times a day (BID) | ORAL | 0 refills | Status: DC

## 2019-12-29 NOTE — ED Triage Notes (Signed)
Pt c/o right great toe pain that started about 1 1/2 weeks ago. Pt said he had an ingrown toenail cut out about a year ago and feels the same.

## 2019-12-29 NOTE — ED Provider Notes (Signed)
[facility] EMERGENCY DEPARTMENT Provider Note   CSN: [id number] Arrival date & time: 12/29/19  0243     History Chief Complaint  Patient presents with   Toe Pain    right great toe    Shawn Ayala is a 54 y.o. male.  The history is provided by the patient.  Toe Pain This is a new problem. The current episode started more than 2 days ago. The problem occurs daily. The problem has been gradually worsening. Nothing aggravates the symptoms. Nothing relieves the symptoms.  Patient reports right great toe pain and swelling over the past week He suspects this is a ingrown toenail.  No fevers or vomiting.  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    Patient Active Problem List   Diagnosis Date Noted   Benign neoplasm of colon 06/14/2013   Diverticulosis of colon (without mention of hemorrhage) 06/14/2013   Substance abuse ([place]) 06/12/2013   Acute esophagitis 06/12/2013   Anemia, iron deficiency 06/11/2013   Hypokalemia 06/11/2013   Abdominal pain, chronic, epigastric 06/11/2013   Gastroparesis 06/11/2013   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  Social History   Tobacco Use   Smoking status: Never Smoker   Smokeless tobacco: Current User    Types: Chew   Tobacco comment: Uses snuff since age 54yr  Vaping Use   Vaping Use: Never used  Substance Use Topics   Alcohol use: No   Drug use: No    Home Medications Prior to Admission medications   Medication Sig Start Date End Date Taking? Authorizing Provider  acetaminophen (TYLENOL) 325 MG tablet Take 650 mg by mouth every 6 (six) hours as needed for mild pain, fever or headache.    [provider]  calcium carbonate (TUMS - DOSED IN MG ELEMENTAL CALCIUM) 500 MG chewable tablet Chew 2-3 tablets by mouth 4 (four) times daily as needed for indigestion or heartburn.     [provider]  Multiple Vitamins-Minerals (SUPER THERA VITE M) TABS Take 1 tablet by mouth daily.    [provider]    Allergies    Reglan [metoclopramide], Ativan [lorazepam], Codeine, Darvocet [propoxyphene n-acetaminophen], Fentanyl, Gabapentin, Hydrocodone, Nsaids, Pregabalin, Tramadol, and Vistaril [hydroxyzine]  Review of Systems   Review of Systems  Constitutional: Negative for fever.  Gastrointestinal: Negative for vomiting.  Skin: Positive for wound.    Physical Exam Updated Vital Signs BP (!) 165/114 (BP Location: Left Arm)    Pulse 97    Temp  98.3 F (36.8 C) (Oral)    Resp 18    Ht 1.575 m (5\' 2" )    Wt 49.9 kg    SpO2 100%    BMI 20.12 kg/m   Physical Exam CONSTITUTIONAL: Well developed, anxious HEAD: Normocephalic/atraumatic EYES: EOMI ENMT: Mucous membranes moist NECK: supple no meningeal signs LUNGS:no apparent distress ABDOMEN: soft NEURO: Pt is awake/alert/appropriate, moves all extremitiesx4.  No facial droop.   EXTREMITIES: pulses normal/equal, full ROM, tenderness to right great toe.  No crepitus.  There is no streaking proximally.  See  photo SKIN: warm, color normal, anxious PSYCH: anxious     Patient gave verbal permission to utilize photo for medical documentation only The image was not stored on any personal device ED Results / Procedures / Treatments   Labs (all labs ordered are listed, but only abnormal results are displayed) Labs Reviewed - No data to display  EKG None  Radiology No results found.  Procedures .Nerve Block  Date/Time: 12/29/2019 4:40 AM Performed by: Ripley Fraise, MD Authorized by: Ripley Fraise, MD   Consent:    Consent obtained:  Verbal   Consent given by:  Patient   Risks discussed:  Pain   Alternatives discussed:  No treatment Indications:    Indications:  Procedural anesthesia Location:    Body area:  Lower extremity   Lower extremity nerve blocked: Right great toe.   Laterality:  Right Pre-procedure details:    Skin preparation:  Povidone-iodine   Preparation: Patient was prepped and draped in usual sterile fashion   Procedure details (see MAR for exact dosages):    Block needle gauge:  25 G   Anesthetic injected:  Lidocaine 1% w/o epi   Injection procedure:  Anatomic landmarks identified Post-procedure details:    Outcome:  Anesthesia achieved   Patient tolerance of procedure:  Tolerated well, no immediate complications Excise ingrown toenail  Date/Time: 12/29/2019 4:45 AM Performed by: Ripley Fraise, MD Authorized by: Ripley Fraise, MD  Preparation: Patient was prepped and draped in the usual sterile fashion. Local anesthesia used: yes Anesthesia: nerve block  Anesthesia: Local anesthesia used: yes Anesthetic total: 5 mL  Sedation: Patient sedated: no  Patient tolerance: patient tolerated the procedure well with no immediate complications Comments: Patient with ingrown toenail of right great toe.  After adequate anesthesia, I was able to excise wedge of of toenail on the lateral surface.  Patient tolerated well.  Area was cleaned pre and post  procedure.       Medications Ordered in ED Medications  doxycycline (VIBRA-TABS) tablet 100 mg (100 mg Oral Given 12/29/19 0329)  oxyCODONE-acetaminophen (PERCOCET/ROXICET) 5-325 MG per tablet 1 tablet (1 tablet Oral Given 12/29/19 0329)  lidocaine (PF) (XYLOCAINE) 1 % injection 30 mL (30 mLs Infiltration Given 12/29/19 0330)  povidone-iodine (BETADINE) 10 % external solution (  Given 12/29/19 0330)    ED Course  I have reviewed the triage vital signs and the nursing notes.       MDM Rules/Calculators/A&P                          Patient tolerated procedure well.  Short course of pain medicine provided.  Will also continue antibiotics.  He was referred to podiatry Final Clinical Impression(s) / ED Diagnoses Final diagnoses:  Ingrown toenail of right foot    Rx / DC Orders ED Discharge Orders         Ordered    doxycycline (VIBRAMYCIN) 100 MG  capsule  2 times daily        12/29/19 0447    oxyCODONE-acetaminophen (PERCOCET) 5-325 MG tablet  Every 8 hours PRN        12/29/19 0447           Ripley Fraise, MD 12/29/19 0507

## 2020-02-01 IMAGING — CT CT ABDOMEN AND PELVIS WITH CONTRAST
2 of 3 series · 13 of 36 positions shown, 18 images · IV contrast (Isovue)
Comparison: 07/16/2018

CLINICAL DATA: Abdominal pain with diverticulitis suspected.

EXAM:
CT ABDOMEN AND PELVIS WITH CONTRAST
TECHNIQUE: Multidetector CT imaging of the abdomen and pelvis was performed
using the standard protocol following bolus administration of
intravenous contrast.
CONTRAST:  100mL OMNIPAQUE IOHEXOL 300 MG/ML  SOLN

[Series 2: axial st · axial · 0.74mm/px · z∈[+988,+1388]mm · 12 of 92 slices shown, 16 images]
[im 6/92  soft-tissue]
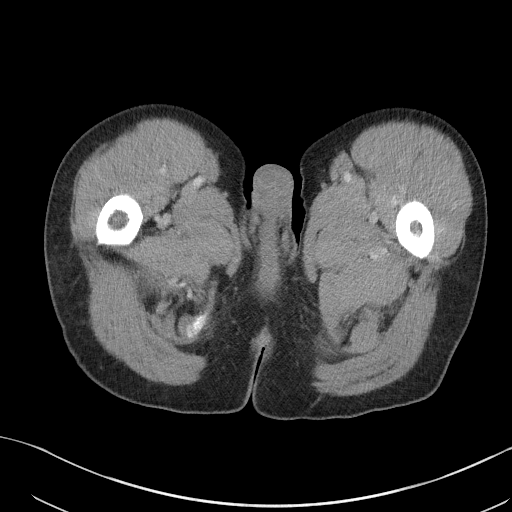
[im 6/92  bone]
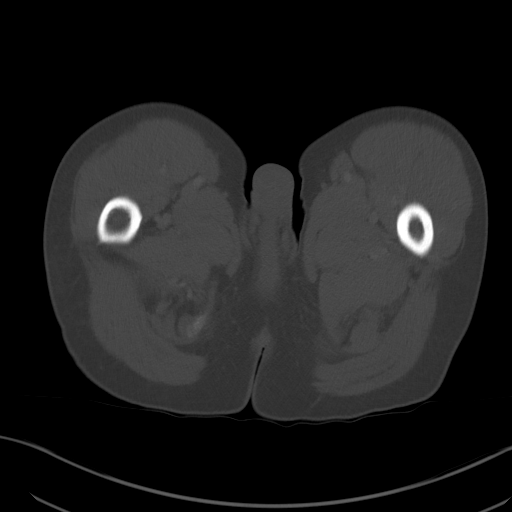
[im 16/92  soft-tissue]
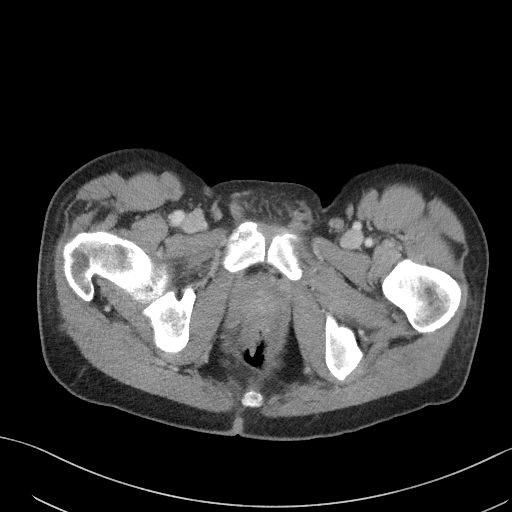
[im 26/92  soft-tissue]
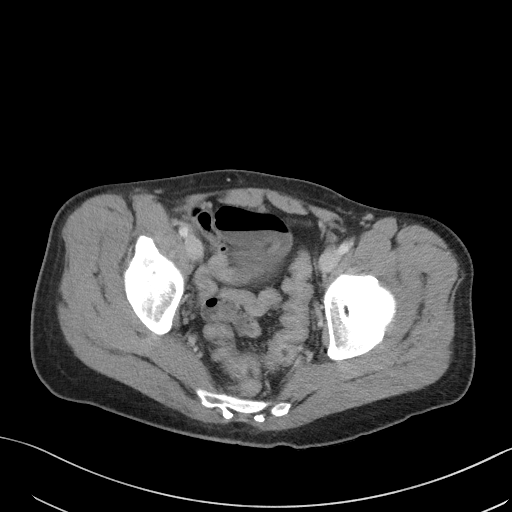
[im 31/92  soft-tissue]
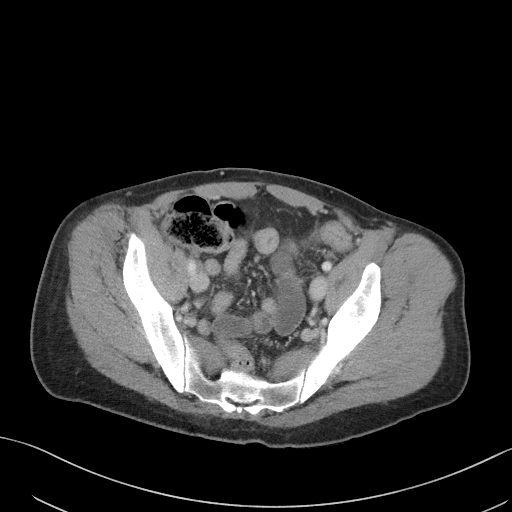
[im 41/92  soft-tissue]
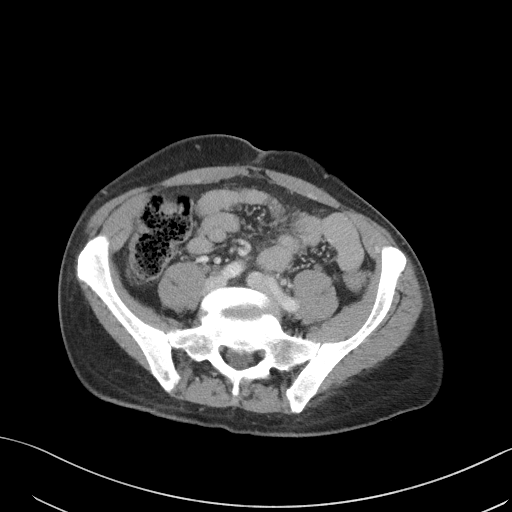
[im 51/92  soft-tissue]
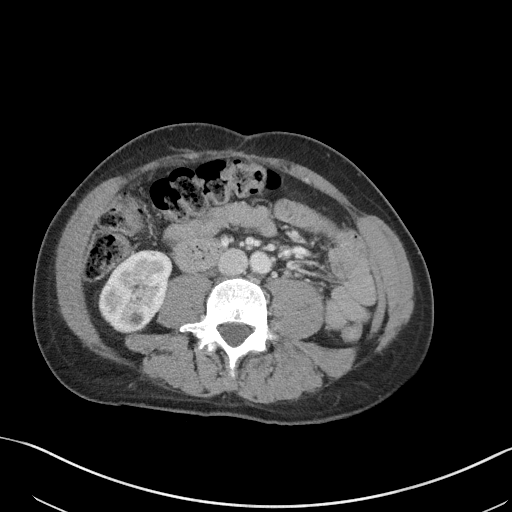
[im 61/92  soft-tissue]
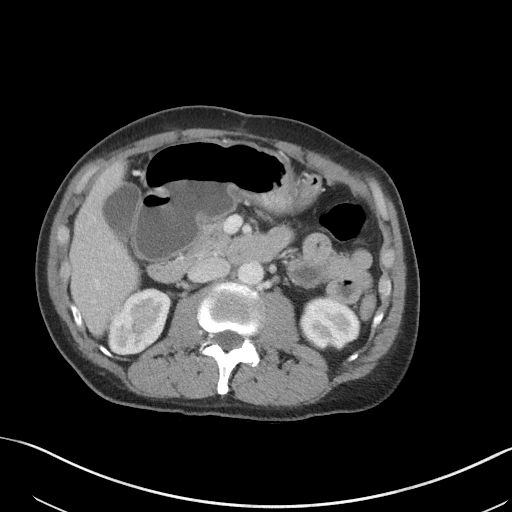
[im 66/92  soft-tissue]
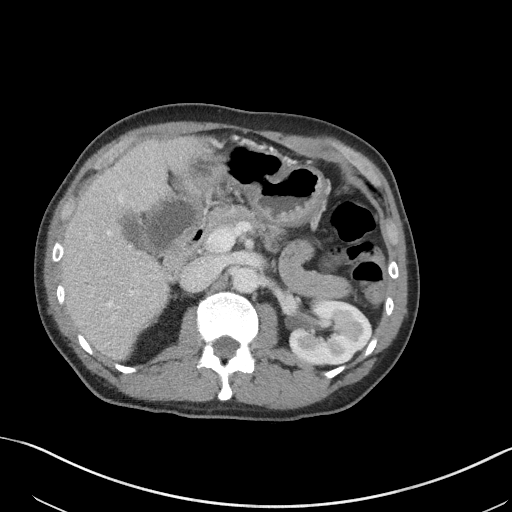
[im 71/92  lung]
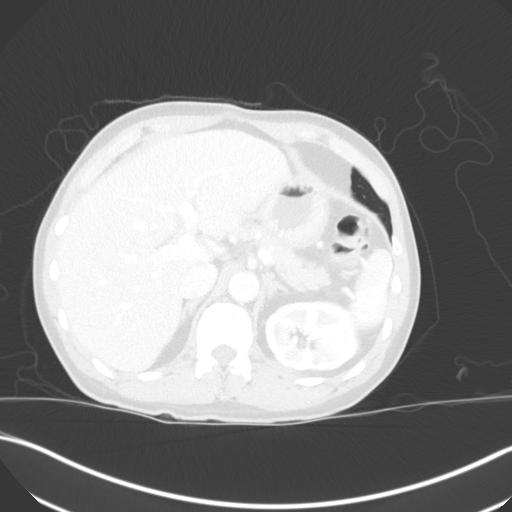
[im 76/92  soft-tissue]
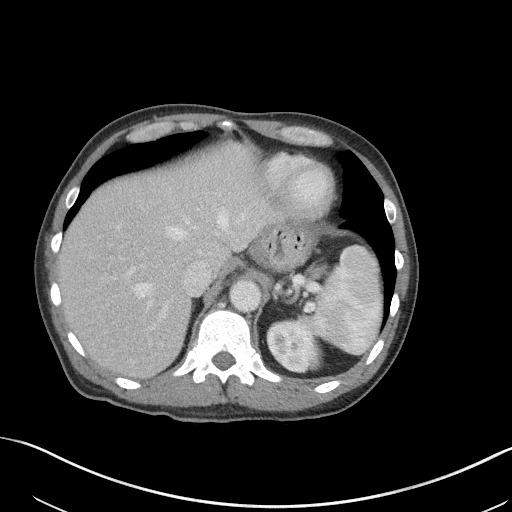
[im 76/92  lung]
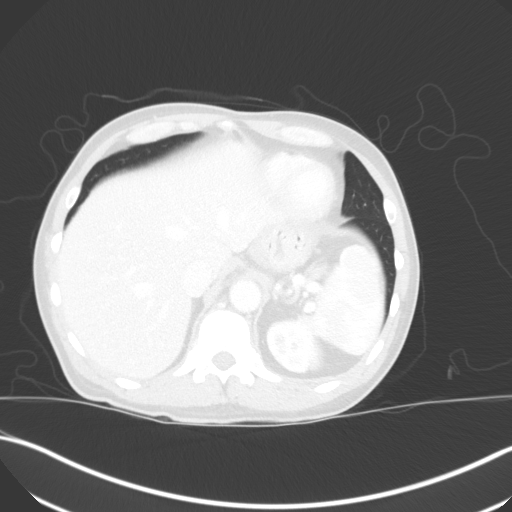
[im 76/92  bone]
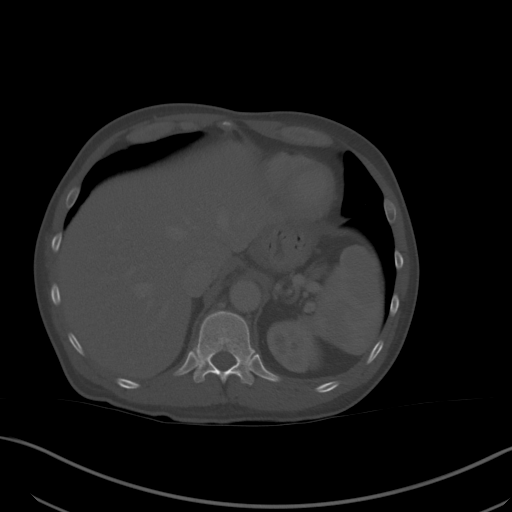
[im 81/92  lung]
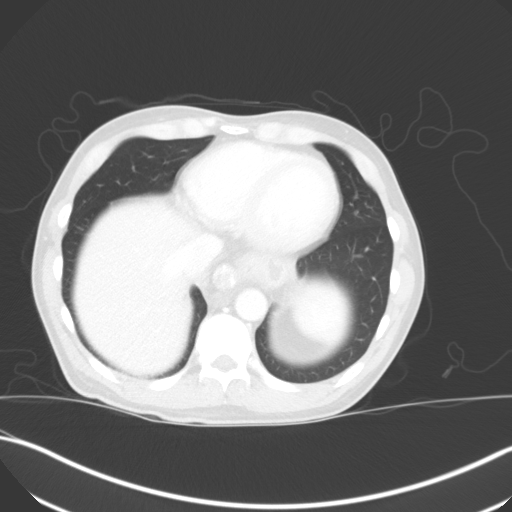
[im 86/92  soft-tissue]
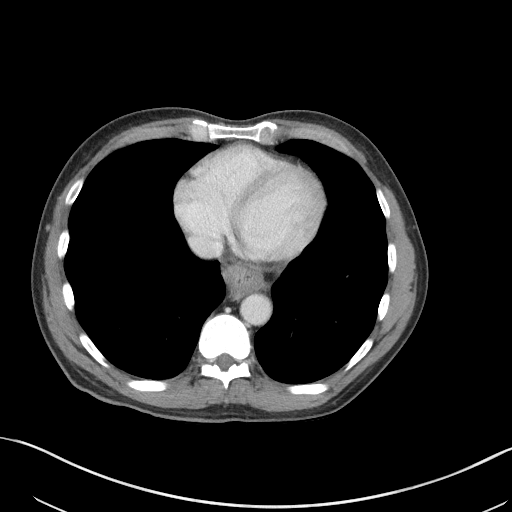
[im 86/92  lung]
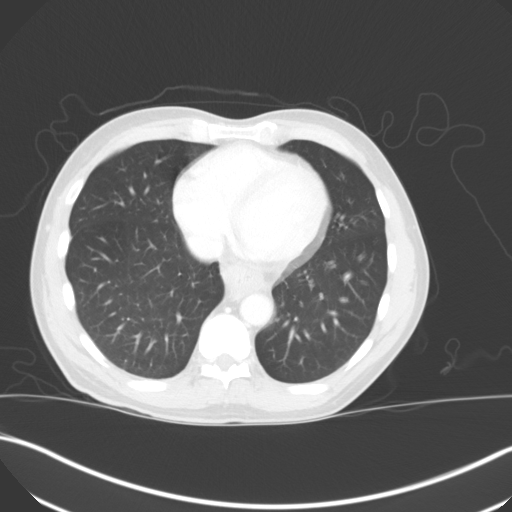

[Series 5: sagittal st · sagittal · 0.61mm/px · 1 of 114 slices shown, 2 images]
[im 38/114  soft-tissue]
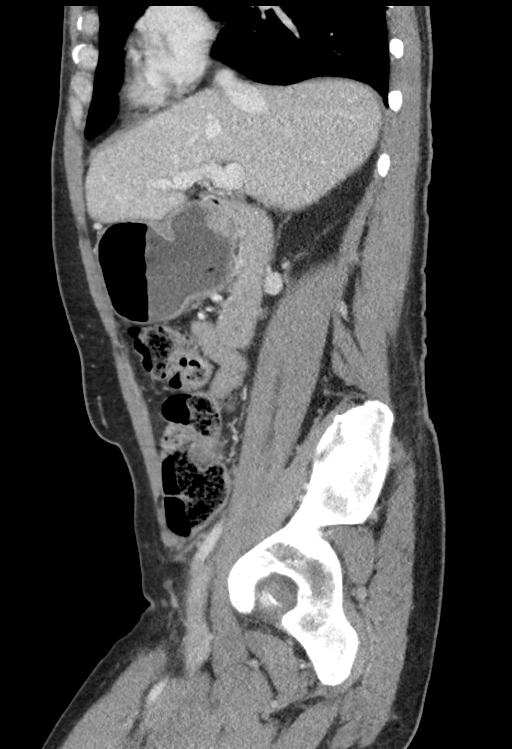
[im 38/114  bone]
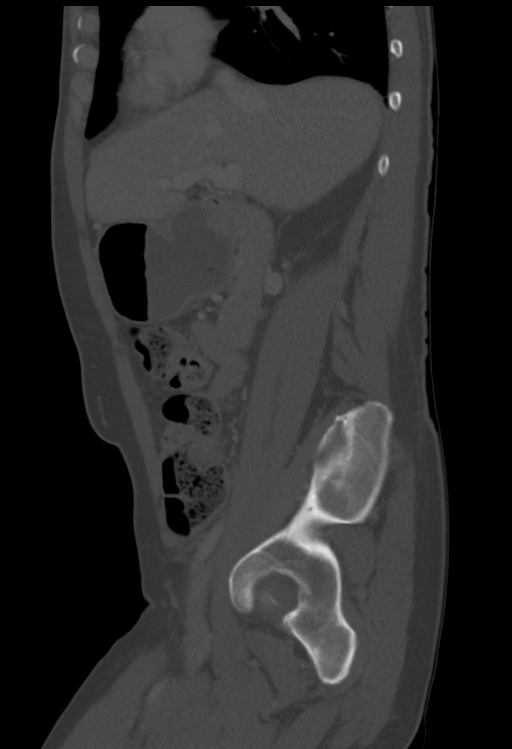

[13 of 36 positions shown; findings below may reference images not displayed]

FINDINGS: Lower chest: Small sliding hiatal hernia. Peripherally calcified
mass with adjacent clip right of the herniated stomach is
stable-question postoperative seroma or excluded diverticulum. No
acute finding

Hepatobiliary: No focal liver abnormality.No evidence of biliary
obstruction or stone.

Pancreas: 21 mm centrally low-density and peripherally calcified
lesion ventral from the pancreas is stable over multiple studies,
likely a complicated cyst when correlated with 8116 appearance.

Spleen: Unremarkable.

Adrenals/Urinary Tract: Negative adrenals. No hydronephrosis or
stone. Small right renal cysts. Unremarkable bladder.

Stomach/Bowel: No obstruction. No appendicitis. Left colonic
diverticulosis without superimposed inflammation. Distended stomach
proximal to the pylorus, also seen previously but no underlying
obstructive lesion is seen.

Vascular/Lymphatic: No acute vascular abnormality. No mass or
adenopathy.

Reproductive:Negative

Other: No ascites or pneumoperitoneum. Left inguinal hernia repair.
No superimposed abnormality to explain acute pain.

Musculoskeletal: No acute abnormalities. L5 chronic pars defects
with grade 1 anterolisthesis.
IMPRESSION: No emergent finding.  Stable from 07/16/2018, as described.

## 2020-02-28 ENCOUNTER — Encounter (INDEPENDENT_AMBULATORY_CARE_PROVIDER_SITE_OTHER): Payer: Self-pay | Admitting: Gastroenterology

## 2020-03-25 IMAGING — DX RIGHT KNEE - COMPLETE 4+ VIEW
4 series · 4 of 4 positions shown · non-contrast
Comparison: Right knee radiograph dated 12/20/2016

CLINICAL DATA: 52-year-old male with right knee pain and swelling.

EXAM:
RIGHT KNEE - COMPLETE 4+ VIEW

[knee ap (1 of 3)]
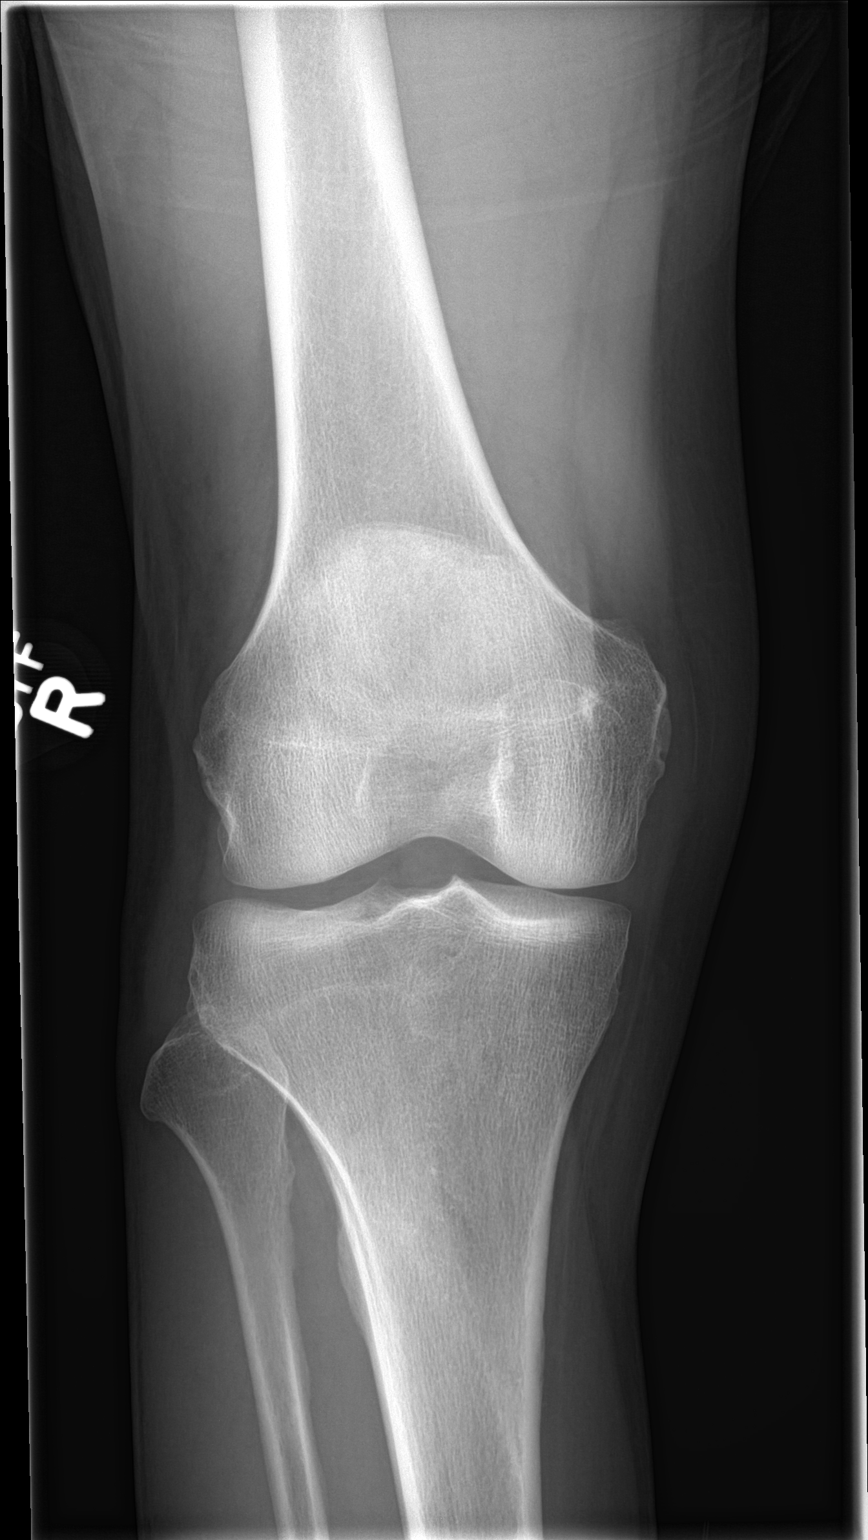

[knee ap (2 of 3)]
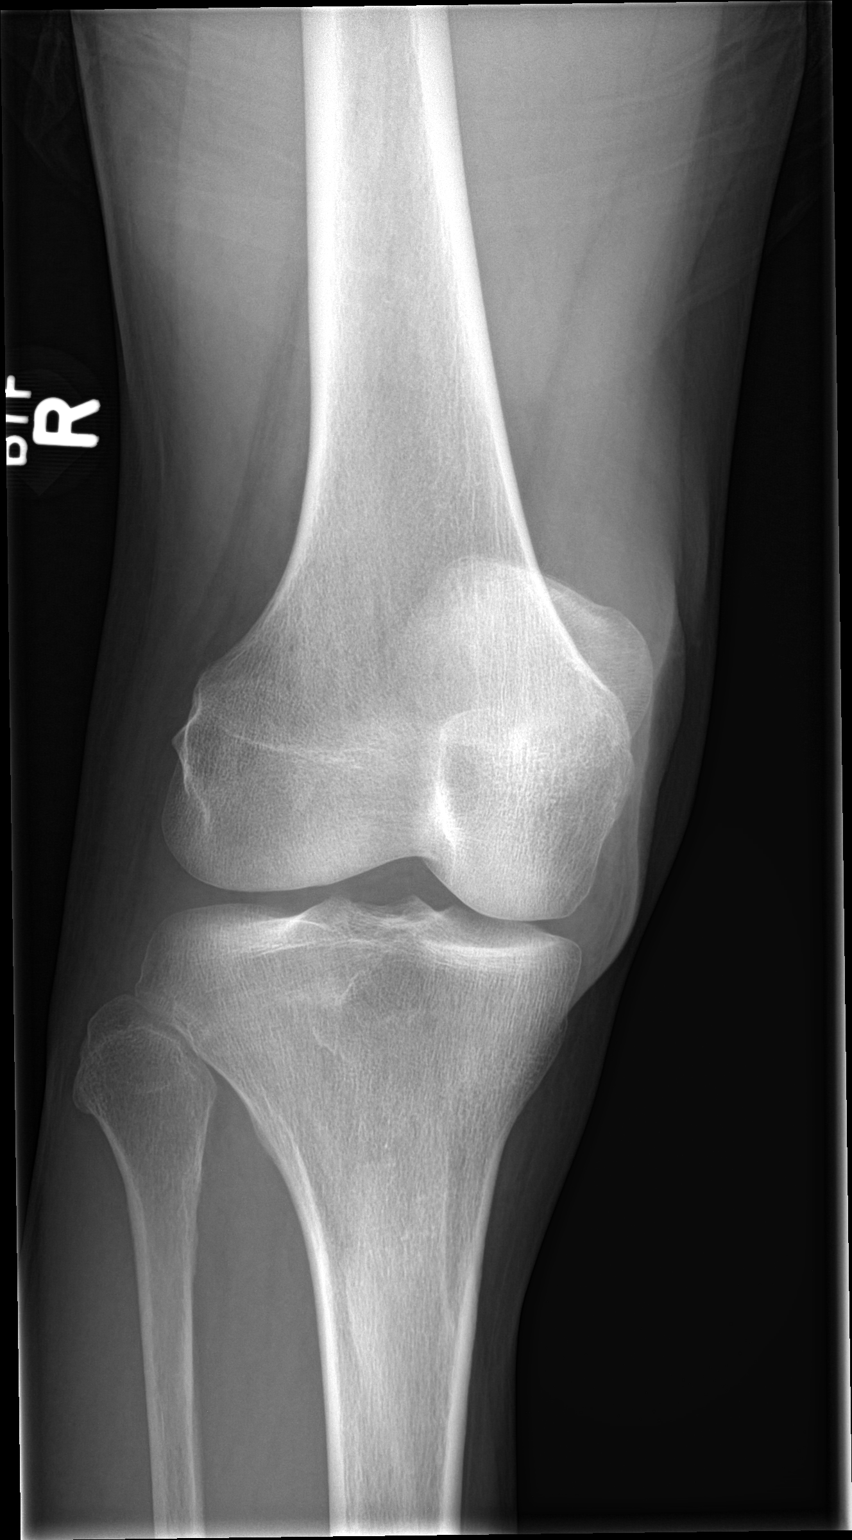

[knee ap (3 of 3)]
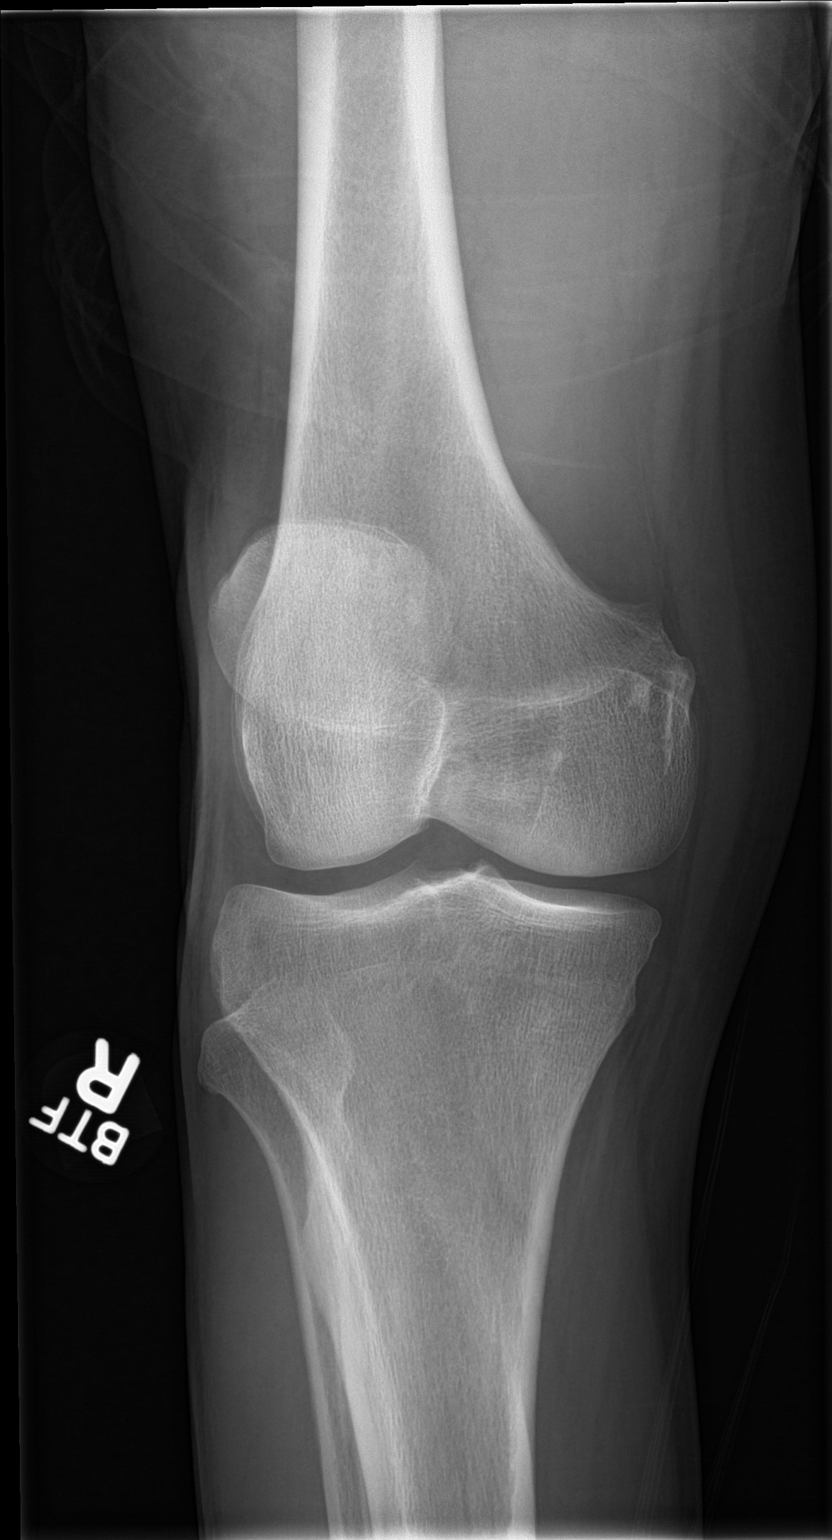

[knee lat]
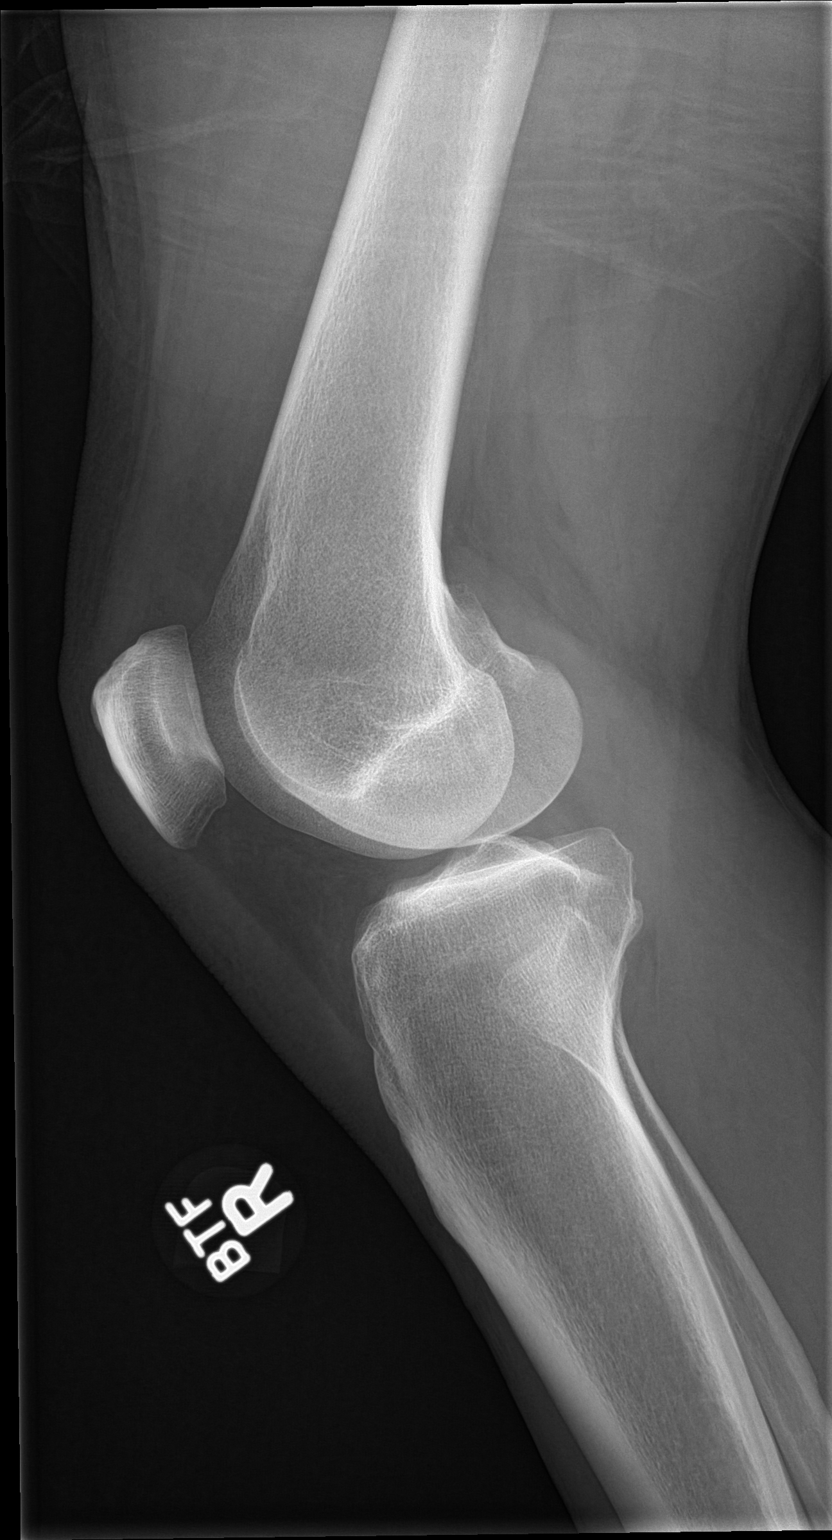

[4 of 4 positions shown; findings below may reference images not displayed]

FINDINGS: There is no acute fracture or dislocation. The bones are well
mineralized. No significant arthritic changes. No joint effusion.
The soft tissues are unremarkable.
IMPRESSION: Negative.

## 2020-05-29 ENCOUNTER — Ambulatory Visit (INDEPENDENT_AMBULATORY_CARE_PROVIDER_SITE_OTHER): Payer: Medicare Other | Admitting: Gastroenterology

## 2020-05-29 ENCOUNTER — Encounter (INDEPENDENT_AMBULATORY_CARE_PROVIDER_SITE_OTHER): Payer: Self-pay | Admitting: Gastroenterology

## 2020-05-29 ENCOUNTER — Telehealth (INDEPENDENT_AMBULATORY_CARE_PROVIDER_SITE_OTHER): Payer: Self-pay

## 2020-05-29 NOTE — Telephone Encounter (Signed)
Patient no showed for his appointment with Dr. Jenetta Downer on 05/29/2020 at [place].

## 2020-05-29 NOTE — Telephone Encounter (Signed)
noted 

## 2023-05-22 ENCOUNTER — Emergency Department (HOSPITAL_COMMUNITY)
Admission: EM | Admit: 2023-05-22 | Discharge: 2023-05-22 | Disposition: A | Discharge status: Home / Self Care | Attending: Emergency Medicine | Admitting: Emergency Medicine

## 2023-05-22 ENCOUNTER — Other Ambulatory Visit: Payer: Self-pay

## 2023-05-22 ENCOUNTER — Encounter (HOSPITAL_COMMUNITY): Payer: Self-pay | Admitting: Emergency Medicine

## 2023-05-22 ENCOUNTER — Emergency Department (HOSPITAL_COMMUNITY)

## 2023-05-22 DIAGNOSIS — N433 Hydrocele, unspecified: Secondary | ICD-10-CM | POA: Diagnosis not present

## 2023-05-22 DIAGNOSIS — I10 Essential (primary) hypertension: Secondary | ICD-10-CM | POA: Insufficient documentation

## 2023-05-22 DIAGNOSIS — R1030 Lower abdominal pain, unspecified: Secondary | ICD-10-CM | POA: Diagnosis present

## 2023-05-22 DIAGNOSIS — X500XXA Overexertion from strenuous movement or load, initial encounter: Secondary | ICD-10-CM | POA: Insufficient documentation

## 2023-05-22 LAB — COMPREHENSIVE METABOLIC PANEL
ALT: 19 U/L (ref 0–44)
AST: 26 U/L (ref 15–41)
Albumin: 3.5 g/dL (ref 3.5–5.0)
Alkaline Phosphatase: 68 U/L (ref 38–126)
Anion gap: 8 (ref 5–15)
BUN: 13 mg/dL (ref 6–20)
CO2: 29 mmol/L (ref 22–32)
Calcium: 8.7 mg/dL — ABNORMAL LOW (ref 8.9–10.3)
Chloride: 101 mmol/L (ref 98–111)
Creatinine, Ser: 0.73 mg/dL (ref 0.61–1.24)
GFR, Estimated: >60 mL/min (ref >60)
Glucose, Bld: 94 mg/dL (ref 70–99)
Potassium: 4.5 mmol/L (ref 3.5–5.1)
Sodium: 138 mmol/L (ref 135–145)
Total Bilirubin: 0.8 mg/dL (ref 0.0–1.2)
Total Protein: 6.6 g/dL (ref 6.5–8.1)

## 2023-05-22 LAB — URINALYSIS, ROUTINE W REFLEX MICROSCOPIC
Bilirubin Urine: NEGATIVE
Glucose, UA: NEGATIVE mg/dL
Hgb urine dipstick: NEGATIVE
Ketones, ur: NEGATIVE mg/dL
Leukocytes,Ua: NEGATIVE
Nitrite: NEGATIVE
Protein, ur: NEGATIVE mg/dL
Specific Gravity, Urine: 1.018 (ref 1.005–1.030)
pH: 7 (ref 5.0–8.0)

## 2023-05-22 LAB — CBC WITH DIFFERENTIAL/PLATELET
Abs Immature Granulocytes: 0.04 10*3/uL (ref 0.00–0.07)
Basophils Absolute: 0 10*3/uL (ref 0.0–0.1)
Basophils Relative: 0 %
Eosinophils Absolute: 0.1 10*3/uL (ref 0.0–0.5)
Eosinophils Relative: 2 %
HCT: 45.6 % (ref 39.0–52.0)
Hemoglobin: 14.3 g/dL (ref 13.0–17.0)
Immature Granulocytes: 1 %
Lymphocytes Relative: 14 %
Lymphs Abs: 1 10*3/uL (ref 0.7–4.0)
MCH: 27.6 pg (ref 26.0–34.0)
MCHC: 31.4 g/dL (ref 30.0–36.0)
MCV: 87.9 fL (ref 80.0–100.0)
Monocytes Absolute: 0.4 10*3/uL (ref 0.1–1.0)
Monocytes Relative: 5 %
Neutro Abs: 5.6 10*3/uL (ref 1.7–7.7)
Neutrophils Relative %: 78 %
Platelets: 367 10*3/uL (ref 150–400)
RBC: 5.19 MIL/uL (ref 4.22–5.81)
RDW: 14.3 % (ref 11.5–15.5)
WBC: 7.2 10*3/uL (ref 4.0–10.5)
nRBC: 0 % (ref 0.0–0.2)

## 2023-05-22 MED ORDER — HYDROMORPHONE HCL 1 MG/ML IJ SOLN
1.0000 mg | Freq: Once | INTRAMUSCULAR | Status: AC
  Administered 2023-05-22: 1 mg via INTRAVENOUS
  Filled 2023-05-22: qty 1

## 2023-05-22 MED ORDER — DOXYCYCLINE HYCLATE 100 MG PO CAPS: 100.0000 mg | ORAL_CAPSULE | Freq: Two times a day (BID) | ORAL | 0 refills | Status: AC

## 2023-05-22 MED ORDER — OXYCODONE-ACETAMINOPHEN 5-325 MG PO TABS: 1.0000 | ORAL_TABLET | Freq: Four times a day (QID) | ORAL | 0 refills | Status: AC | PRN

## 2023-05-22 NOTE — ED Provider Notes (Signed)
 [place] EMERGENCY DEPARTMENT AT [facility] Provider Note   CSN: [id number] Arrival date & time: 05/22/23  4098     History {Add pertinent medical, surgical, social history, OB history to HPI:1} Chief Complaint  Patient presents with   Groin Pain    Shawn Ayala is a 58 y.o. male.  Patient has a history of hypertension and GERD.  He states he was doing some lifting and has pain in his groin.   Groin Pain       Home Medications Prior to Admission medications   Medication Sig Start Date End Date Taking? Authorizing Provider  doxycycline (VIBRAMYCIN) 100 MG capsule Take 1 capsule (100 mg total) by mouth 2 (two) times daily. One po bid x 7 days 05/22/23  Yes Bethann Berkshire, MD  oxyCODONE-acetaminophen (PERCOCET/ROXICET) 5-325 MG tablet Take 1 tablet by mouth every 6 (six) hours as needed for severe pain (pain score 7-10). 05/22/23  Yes Bethann Berkshire, MD  acetaminophen (TYLENOL) 325 MG tablet Take 650 mg by mouth every 6 (six) hours as needed for mild pain, fever or headache.    [provider]  calcium carbonate (TUMS - DOSED IN MG ELEMENTAL CALCIUM) 500 MG chewable tablet Chew 2-3 tablets by mouth 4 (four) times daily as needed for indigestion or heartburn.     [provider]  Multiple Vitamins-Minerals (SUPER THERA VITE M) TABS Take 1 tablet by mouth daily.    [provider]      Allergies    Reglan [metoclopramide], Ativan [lorazepam], Codeine, Darvocet [propoxyphene n-acetaminophen], Fentanyl, Gabapentin, Hydrocodone, Nsaids, Pregabalin, Tramadol, and Vistaril [hydroxyzine]    Review of Systems   Review of Systems  Physical Exam Updated Vital Signs BP (!) 175/103 (BP Location: Right Arm)   Pulse 69   Temp 98.1 F (36.7 C) (Oral)   Resp 18   Ht 5\' 2"  (1.575 m)   Wt 50 kg   SpO2 100%   BMI 20.16 kg/m  Physical Exam  ED Results / Procedures / Treatments   Labs (all labs ordered are listed, but only abnormal results are  displayed) Labs Reviewed  COMPREHENSIVE METABOLIC PANEL - Abnormal; Notable for the following components:      Result Value   Calcium 8.7 (*)    All other components within normal limits  CBC WITH DIFFERENTIAL/PLATELET  URINALYSIS, ROUTINE W REFLEX MICROSCOPIC    EKG None  Radiology US SCROTUM W/DOPPLER Result Date: 05/22/2023 CLINICAL DATA:  Right testicular pain. EXAM: SCROTAL ULTRASOUND DOPPLER ULTRASOUND OF THE TESTICLES TECHNIQUE: Complete ultrasound examination of the testicles, epididymis, and other scrotal structures was performed. Color and spectral Doppler ultrasound were also utilized to evaluate blood flow to the testicles. COMPARISON:  Scrotal ultrasound dated 05/14/2023. FINDINGS: Right testicle Measurements: 3.9 x 2.3 x 3.4 cm. No mass or microlithiasis visualized. Left testicle Measurements: 4.3 x 2.3 x 3.2 cm. No mass or microlithiasis visualized. Right epididymis:  Normal in size and appearance. Left epididymis:  Normal in size and appearance. Hydrocele:  Moderate bilateral hydroceles are noted. Varicocele:  None visualized. Pulsed Doppler interrogation of both testes demonstrates normal low resistance arterial and venous waveforms bilaterally. IMPRESSION: Moderate bilateral hydroceles. No evidence of testicular torsion. Electronically Signed   By: Romona Curls M.D.   On: 05/22/2023 11:52    Procedures Procedures  {Document cardiac monitor, telemetry assessment procedure when appropriate:1}  Medications Ordered in ED Medications  HYDROmorphone (DILAUDID) injection 1 mg (1 mg Intravenous Given 05/22/23 1012)  HYDROmorphone (DILAUDID) injection 1 mg (  1 mg Intravenous Given 05/22/23 1148)    ED Course/ Medical Decision Making/ A&P   {   Click here for ABCD2, HEART and other calculatorsREFRESH Note before signing :1}                              Medical Decision Making Amount and/or Complexity of Data Reviewed Labs: ordered. Radiology: ordered.  Risk Prescription drug  management.   Patient with bilateral hydroceles with tenderness and pain in his scrotum.  Probable groin strain also.  He is placed on doxycycline and Percocet and referred to urology  {Document critical care time when appropriate:1} {Document review of labs and clinical decision tools ie heart score, Chads2Vasc2 etc:1}  {Document your independent review of radiology images, and any outside records:1} {Document your discussion with family members, caretakers, and with consultants:1} {Document social determinants of health affecting pt's care:1} {Document your decision making why or why not admission, treatments were needed:1} Final Clinical Impression(s) / ED Diagnoses Final diagnoses:  None    Rx / DC Orders ED Discharge Orders          Ordered    doxycycline (VIBRAMYCIN) 100 MG capsule  2 times daily        05/22/23 1245    oxyCODONE-acetaminophen (PERCOCET/ROXICET) 5-325 MG tablet  Every 6 hours PRN        05/22/23 1245

## 2023-05-22 NOTE — Discharge Instructions (Signed)
 Follow-up with the lung and urology this week

## 2023-05-22 NOTE — ED Triage Notes (Signed)
 Pt arrives POV. Reports pain in the right testicle that radiates down left leg. Pt states " It feels like there's a third nut." Pain began suddenly on Tuesday 05/18/23 while patient was lifting a heavy object. History of inguinal hernia that was surgically repaired 20+ years PTA. Pain 9-10 with ambulation making it worse.

## 2023-05-27 ENCOUNTER — Encounter: Payer: Self-pay | Admitting: *Deleted
